# Patient Record
Sex: Female | Born: 2000 | Race: Black or African American | Hispanic: No | Marital: Single | Smoking: Never smoker
Health system: Southern US, Community
[De-identification: ages and names within clinical notes are randomized; demographics above are authoritative.]

## PROBLEM LIST (undated history)

## (undated) DIAGNOSIS — IMO0002 Reserved for concepts with insufficient information to code with codable children: Secondary | ICD-10-CM

## (undated) DIAGNOSIS — D649 Anemia, unspecified: Secondary | ICD-10-CM

## (undated) DIAGNOSIS — A749 Chlamydial infection, unspecified: Secondary | ICD-10-CM

## (undated) DIAGNOSIS — Z5189 Encounter for other specified aftercare: Secondary | ICD-10-CM

## (undated) DIAGNOSIS — F32A Depression, unspecified: Secondary | ICD-10-CM

## (undated) DIAGNOSIS — D689 Coagulation defect, unspecified: Secondary | ICD-10-CM

## (undated) DIAGNOSIS — K529 Noninfective gastroenteritis and colitis, unspecified: Secondary | ICD-10-CM

## (undated) DIAGNOSIS — F419 Anxiety disorder, unspecified: Secondary | ICD-10-CM

## (undated) HISTORY — DX: Coagulation defect, unspecified: D68.9

## (undated) HISTORY — PX: FRACTURE SURGERY: SHX138

## (undated) HISTORY — DX: Encounter for other specified aftercare: Z51.89

## (undated) HISTORY — DX: Depression, unspecified: F32.A

## (undated) HISTORY — DX: Reserved for concepts with insufficient information to code with codable children: IMO0002

## (undated) HISTORY — DX: Anxiety disorder, unspecified: F41.9

## (undated) HISTORY — DX: Chlamydial infection, unspecified: A74.9

## (undated) HISTORY — DX: Anemia, unspecified: D64.9

## (undated) HISTORY — PX: FEMUR CLOSED REDUCTION: SHX939

---
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
## 2019-08-05 ENCOUNTER — Encounter: Payer: Self-pay | Admitting: Advanced Practice Midwife

## 2019-08-05 ENCOUNTER — Other Ambulatory Visit: Payer: Self-pay

## 2019-08-05 ENCOUNTER — Ambulatory Visit (INDEPENDENT_AMBULATORY_CARE_PROVIDER_SITE_OTHER): Payer: Medicaid Other | Admitting: Advanced Practice Midwife

## 2019-08-05 VITALS — BP 108/73 | HR 73 | Ht 64.0 in | Wt 125.0 lb

## 2019-08-05 DIAGNOSIS — Z3042 Encounter for surveillance of injectable contraceptive: Secondary | ICD-10-CM | POA: Insufficient documentation

## 2019-08-05 DIAGNOSIS — Z3202 Encounter for pregnancy test, result negative: Secondary | ICD-10-CM | POA: Diagnosis not present

## 2019-08-05 LAB — POCT URINE PREGNANCY: Preg Test, Ur: NEGATIVE

## 2019-08-05 MED ORDER — MEDROXYPROGESTERONE ACETATE 150 MG/ML IM SUSP
150.0000 mg | Freq: Once | INTRAMUSCULAR | Status: AC
  Administered 2019-08-05: 150 mg via INTRAMUSCULAR

## 2019-08-05 MED ORDER — MEDROXYPROGESTERONE ACETATE 150 MG/ML IM SUSP: 150.0000 mg | INTRAMUSCULAR | 2 refills | Status: DC

## 2019-08-05 NOTE — Progress Notes (Signed)
[  Has been on depo for 5 years and has missed last injection due 8/3 and would like to start back  Last unprotected sex was 8/10  Last depo was may 11th

## 2019-08-05 NOTE — Progress Notes (Signed)
    CONTRACEPTION CARE ENCOUNTER NOTE  History:     Mackenzie Rios is a 18 y.o. G0P0000 female here to restart her Depo provera.  Current complaints: None.   Denies abnormal vaginal bleeding, discharge, pelvic pain, problems with intercourse or other gynecologic concerns.    Gynecologic History No LMP recorded. Patient has had an injection. Contraception: Depo-Provera injections Last Pap: N/A age 62.   Obstetric History OB History  Gravida Para Term Preterm AB Living  0 0 0 0 0 0  SAB TAB Ectopic Multiple Live Births  0 0 0 0 0    History reviewed. No pertinent past medical history.  Past Surgical History:  Procedure Laterality Date  . FEMUR CLOSED REDUCTION      Current Outpatient Medications on File Prior to Visit  Medication Sig Dispense Refill  . cephALEXin (KEFLEX) 500 MG capsule Take 1 capsule (500 mg total) by mouth 4 (four) times daily. (Patient not taking: Reported on 08/05/2019) 28 capsule 0   No current facility-administered medications on file prior to visit.     No Known Allergies  Social History:  reports that she has never smoked. She has never used smokeless tobacco. She reports current drug use. Drug: Marijuana. She reports that she does not drink alcohol.  History reviewed. No pertinent family history.  The following portions of the patient's history were reviewed and updated as appropriate: allergies, current medications, past family history, past medical history, past social history, past surgical history and problem list.  Review of Systems Pertinent items noted in HPI and remainder of comprehensive ROS otherwise negative.  Physical Exam:  BP 108/73   Pulse 73   Ht 5\' 4"  (1.626 m)   Wt 125 lb (56.7 kg)   BMI 21.46 kg/m  CONSTITUTIONAL: Well-developed, well-nourished female in no acute distress.  HENT:  Normocephalic, atraumatic, External right and left ear normal. Oropharynx is clear and moist EYES: Conjunctivae and EOM are normal. Pupils are  equal, round, and reactive to light. No scleral icterus.  NECK: Normal range of motion, supple, no masses.  Normal thyroid.  SKIN: Skin is warm and dry. No rash noted. Not diaphoretic. No erythema. No pallor. MUSCULOSKELETAL: Normal range of motion. No tenderness.  No cyanosis, clubbing, or edema.  2+ distal pulses. NEUROLOGIC: Alert and oriented to person, place, and time. Normal reflexes, muscle tone coordination. No cranial nerve deficit noted. PSYCHIATRIC: Normal mood and affect. Normal behavior. Normal judgment and thought content. CARDIOVASCULAR: Normal heart rate noted, regular rhythm RESPIRATORY: Effort normal, no problems with respiration noted.    Assessment and Plan:    1. Encounter for Depo-Provera contraception  - POCT urine pregnancy= negative - Treat as new start Depo given interval since previous injection - Counseling about other methods declined by patient  Routine preventative health maintenance measures emphasized. Please refer to After Visit Summary for other counseling recommendations.     Mallie Snooks, MSN, CNM Certified Nurse Midwife, [facility] for [employer], [place] [facility] 08/05/19 12:33 PM

## 2019-08-05 NOTE — Patient Instructions (Signed)
Medroxyprogesterone injection [Contraceptive] What is this medicine? MEDROXYPROGESTERONE (me DROX ee proe JES te rone) contraceptive injections prevent pregnancy. They provide effective birth control for 3 months. Depo-subQ Provera 104 is also used for treating pain related to endometriosis. This medicine may be used for other purposes; ask your health care provider or pharmacist if you have questions. COMMON BRAND NAME(S): Depo-Provera, Depo-subQ Provera 104 What should I tell my health care provider before I take this medicine? They need to know if you have any of these conditions:  frequently drink alcohol  asthma  blood vessel disease or a history of a blood clot in the lungs or legs  bone disease such as osteoporosis  breast cancer  diabetes  eating disorder (anorexia nervosa or bulimia)  high blood pressure  HIV infection or AIDS  kidney disease  liver disease  mental depression  migraine  seizures (convulsions)  stroke  tobacco smoker  vaginal bleeding  an unusual or allergic reaction to medroxyprogesterone, other hormones, medicines, foods, dyes, or preservatives  pregnant or trying to get pregnant  breast-feeding How should I use this medicine? Depo-Provera Contraceptive injection is given into a muscle. Depo-subQ Provera 104 injection is given under the skin. These injections are given by a health care professional. You must not be pregnant before getting an injection. The injection is usually given during the first 5 days after the start of a menstrual period or 6 weeks after delivery of a baby. Talk to your pediatrician regarding the use of this medicine in children. Special care may be needed. These injections have been used in female children who have started having menstrual periods. Overdosage: If you think you have taken too much of this medicine contact a poison control center or emergency room at once. NOTE: This medicine is only for you. Do not  share this medicine with others. What if I miss a dose? Try not to miss a dose. You must get an injection once every 3 months to maintain birth control. If you cannot keep an appointment, call and reschedule it. If you wait longer than 13 weeks between Depo-Provera contraceptive injections or longer than 14 weeks between Depo-subQ Provera 104 injections, you could get pregnant. Use another method for birth control if you miss your appointment. You may also need a pregnancy test before receiving another injection. What may interact with this medicine? Do not take this medicine with any of the following medications:  bosentan This medicine may also interact with the following medications:  aminoglutethimide  antibiotics or medicines for infections, especially rifampin, rifabutin, rifapentine, and griseofulvin  aprepitant  barbiturate medicines such as phenobarbital or primidone  bexarotene  carbamazepine  medicines for seizures like ethotoin, felbamate, oxcarbazepine, phenytoin, topiramate  modafinil  St. John's wort This list may not describe all possible interactions. Give your health care provider a list of all the medicines, herbs, non-prescription drugs, or dietary supplements you use. Also tell them if you smoke, drink alcohol, or use illegal drugs. Some items may interact with your medicine. What should I watch for while using this medicine? This drug does not protect you against HIV infection (AIDS) or other sexually transmitted diseases. Use of this product may cause you to lose calcium from your bones. Loss of calcium may cause weak bones (osteoporosis). Only use this product for more than 2 years if other forms of birth control are not right for you. The longer you use this product for birth control the more likely you will be at risk   for weak bones. Ask your health care professional how you can keep strong bones. You may have a change in bleeding pattern or irregular periods.  Many females stop having periods while taking this drug. If you have received your injections on time, your chance of being pregnant is very low. If you think you may be pregnant, see your health care professional as soon as possible. Tell your health care professional if you want to get pregnant within the next year. The effect of this medicine may last a long time after you get your last injection. What side effects may I notice from receiving this medicine? Side effects that you should report to your doctor or health care professional as soon as possible:  allergic reactions like skin rash, itching or hives, swelling of the face, lips, or tongue  breast tenderness or discharge  breathing problems  changes in vision  depression  feeling faint or lightheaded, falls  fever  pain in the abdomen, chest, groin, or leg  problems with balance, talking, walking  unusually weak or tired  yellowing of the eyes or skin Side effects that usually do not require medical attention (report to your doctor or health care professional if they continue or are bothersome):  acne  fluid retention and swelling  headache  irregular periods, spotting, or absent periods  temporary pain, itching, or skin reaction at site where injected  weight gain This list may not describe all possible side effects. Call your doctor for medical advice about side effects. You may report side effects to FDA at 1-800-FDA-1088. Where should I keep my medicine? This does not apply. The injection will be given to you by a health care professional. NOTE: This sheet is a summary. It may not cover all possible information. If you have questions about this medicine, talk to your doctor, pharmacist, or health care provider.  2020 Elsevier/Gold Standard (2008-12-10 18:37:56)   Intrauterine Device Insertion An intrauterine device (IUD) is a medical device that gets inserted into the uterus to prevent pregnancy. It is a  small, T-shaped device that has one or two nylon strings hanging down from it. The strings hang out of the lower part of the uterus (cervix) to allow for future IUD removal. There are two types of IUDs available:  Copper IUD. This type of IUD has copper wire wrapped around it. Copper makes the uterus and fallopian tubes produce a fluid that kills sperm. A copper IUD may last up to 10 years.  Hormone IUD. This type of IUD is made of plastic and contains the hormone progestin (synthetic progesterone). The hormone thickens mucus in the cervix and prevents sperm from entering the uterus. It also thins the uterine lining to prevent implantation of a fertilized egg. The hormone can weaken or kill the sperm that get into the uterus. A hormone IUD may last 3-5 years. Tell a health care provider about:  Any allergies you have.  All medicines you are taking, including vitamins, herbs, eye drops, creams, and over-the-counter medicines.  Any problems you or family members have had with anesthetic medicines.  Any blood disorders you have.  Any surgeries you have had.  Any medical conditions you have, including any STIs (sexually transmitted infections) you may have.  Whether you are pregnant or may be pregnant. What are the risks? Generally, this is a safe procedure. However, problems may occur, including:  Infection.  Bleeding.  Allergic reactions to medicines.  Accidental puncture (perforation) of the uterus, or damage to  other structures or organs.  Accidental placement of the IUD either in the muscle layer of the uterus (myometrium) or outside the uterus.  The IUD falling out of the uterus (expulsion). This is more common among women who have recently had a child.  Pregnancy that happens in the fallopian tube (ectopic pregnancy).  Infection of the uterus and fallopian tubes (pelvic inflammatory disease). What happens before the procedure?  Schedule the IUD insertion for when you will  have your menstrual period or right after, to make sure you are not pregnant. Placement of the IUD is better tolerated shortly after a menstrual cycle.  Follow instructions from your health care provider about eating or drinking restrictions.  Ask your health care provider about changing or stopping your regular medicines. This is especially important if you are taking diabetes medicines or blood thinners.  You may get a pain reliever to take before the procedure.  You may have tests for: ? Pregnancy. A pregnancy test involves having a urine sample taken. ? STIs. Placing an IUD in someone who has an STI can make the infection worse. ? Cervical cancer. You may have a Pap test to check for this type of cancer. This means collecting cells from your cervix to be examined under a microscope.  You may have a physical exam to determine the size and position of your uterus. The procedure may vary among health care providers and hospitals. What happens during the procedure?  A tool (speculum) will be placed in your vagina and widened so that your health care provider can see your cervix.  Medicine may be applied to your cervix to help lower your risk of infection (antiseptic medicine).  You may be given an anesthetic medicine to numb each side of your cervix (intracervical block or paracervical block). This medicine is usually given by an injection into the cervix.  A tool (uterine sound) will be inserted into your uterus to determine the length of your uterus and the direction that your uterus may be tilted.  A slim instrument or tube (IUD inserter) that holds the IUD will be inserted into your vagina, through your cervical canal, and into your uterus.  The IUD will be placed in the uterus, and the IUD inserter will be removed.  The strings that are attached to the IUD will be trimmed so that they lie just below the cervix. The procedure may vary among health care providers and hospitals. What  happens after the procedure?  You may have bleeding after the procedure. This is normal. It varies from light bleeding (spotting) for a few days to menstrual-like bleeding.  You may have cramping and pain.  You may feel dizzy or light-headed.  You may have lower back pain. Summary  An intrauterine device (IUD) is a small, T-shaped device that has one or two nylon strings hanging down from it.  Two types of IUDs are available. You may have a copper IUD or a hormone IUD.  Schedule the IUD insertion for when you will have your menstrual period or right after, to make sure you are not pregnant. Placement of the IUD is better tolerated shortly after a menstrual cycle.  You may have bleeding after the procedure. This is normal. It varies from light spotting for a few days to menstrual-like bleeding. This information is not intended to replace advice given to you by your health care provider. Make sure you discuss any questions you have with your health care provider. Document Released: 07/18/2011 Document  Revised: 11/01/2017 Document Reviewed: 10/10/2016 Elsevier Patient Education  [place].

## 2019-10-21 ENCOUNTER — Ambulatory Visit: Payer: Medicaid Other

## 2019-10-22 ENCOUNTER — Ambulatory Visit (INDEPENDENT_AMBULATORY_CARE_PROVIDER_SITE_OTHER): Payer: Medicaid Other | Admitting: *Deleted

## 2019-10-22 ENCOUNTER — Other Ambulatory Visit: Payer: Self-pay

## 2019-10-22 VITALS — BP 106/70 | HR 92

## 2019-10-22 DIAGNOSIS — Z3042 Encounter for surveillance of injectable contraceptive: Secondary | ICD-10-CM | POA: Diagnosis not present

## 2019-10-22 MED ORDER — MEDROXYPROGESTERONE ACETATE 150 MG/ML IM SUSP
150.0000 mg | Freq: Once | INTRAMUSCULAR | Status: AC
  Administered 2019-10-22: 150 mg via INTRAMUSCULAR

## 2019-10-22 NOTE — Progress Notes (Signed)
Date last pap: NA  Last Depo-Provera:08/05/2019. Side Effects if any: NA. Serum HCG indicated? NA. Depo-Provera 150 mg IM given by: Crosby Oyster, RN. Next appointment due Feb 4-Feb 18th 2021.

## 2019-10-27 NOTE — Progress Notes (Signed)
Patient seen and assessed by nursing staff during this encounter. I have reviewed the chart and agree with the documentation and plan.  Zackory Pudlo, MD 10/27/2019 12:19 AM    

## 2019-12-03 ENCOUNTER — Encounter (HOSPITAL_COMMUNITY): Payer: Self-pay | Admitting: *Deleted

## 2019-12-03 ENCOUNTER — Emergency Department (HOSPITAL_COMMUNITY)
Admission: EM | Admit: 2019-12-03 | Discharge: 2019-12-03 | Disposition: A | Discharge status: Home / Self Care | Payer: BC Managed Care – PPO | Attending: Emergency Medicine | Admitting: Emergency Medicine

## 2019-12-03 ENCOUNTER — Other Ambulatory Visit: Payer: Self-pay

## 2019-12-03 DIAGNOSIS — Z79899 Other long term (current) drug therapy: Secondary | ICD-10-CM | POA: Insufficient documentation

## 2019-12-03 DIAGNOSIS — S41012A Laceration without foreign body of left shoulder, initial encounter: Secondary | ICD-10-CM | POA: Diagnosis not present

## 2019-12-03 DIAGNOSIS — Y999 Unspecified external cause status: Secondary | ICD-10-CM | POA: Insufficient documentation

## 2019-12-03 DIAGNOSIS — S41052A Open bite of left shoulder, initial encounter: Secondary | ICD-10-CM

## 2019-12-03 DIAGNOSIS — Y929 Unspecified place or not applicable: Secondary | ICD-10-CM | POA: Diagnosis not present

## 2019-12-03 DIAGNOSIS — Y939 Activity, unspecified: Secondary | ICD-10-CM | POA: Diagnosis not present

## 2019-12-03 DIAGNOSIS — W540XXA Bitten by dog, initial encounter: Secondary | ICD-10-CM | POA: Diagnosis not present

## 2019-12-03 DIAGNOSIS — S4992XA Unspecified injury of left shoulder and upper arm, initial encounter: Secondary | ICD-10-CM | POA: Diagnosis present

## 2019-12-03 MED ORDER — AMOXICILLIN-POT CLAVULANATE 875-125 MG PO TABS: 1.0000 | ORAL_TABLET | Freq: Two times a day (BID) | ORAL | 0 refills | Status: AC

## 2019-12-03 NOTE — ED Provider Notes (Signed)
[facility] Provider Note   CSN: [id number] Arrival date & time: 12/03/19  0032     History Chief Complaint  Patient presents with  . Animal Bite    Mackenzie Rios is a 18 y.o. female.  Pt & friend were play fighting, pt's dog became upset & bit pt's shoulder.  Has 2 bite wounds to anterior shoulder, 1 wound to posterior shoulder.    Animal Bite Contact animal:  Dog Location:  Shoulder/arm Shoulder/arm injury location:  L shoulder Pain details:    Quality:  Aching   Severity:  Mild Incident location:  Home Animal's rabies vaccination status:  Up to date Animal in possession: yes   Tetanus status:  Up to date Relieved by:  None tried Associated symptoms: no fever, no numbness and no swelling        History reviewed. No pertinent past medical history.  Patient Active Problem List   Diagnosis Date Noted  . Depo-Provera contraceptive status 08/05/2019    Past Surgical History:  Procedure Laterality Date  . FEMUR CLOSED REDUCTION       OB History    Gravida  0   Para  0   Term  0   Preterm  0   AB  0   Living  0     SAB  0   TAB  0   Ectopic  0   Multiple  0   Live Births  0           No family history on file.  Social History   Tobacco Use  . Smoking status: Never Smoker  . Smokeless tobacco: Never Used  Substance Use Topics  . Alcohol use: No  . Drug use: Yes    Types: Marijuana    Home Medications Prior to Admission medications   Medication Sig Start Date End Date Taking? Authorizing Provider  amoxicillin-clavulanate (AUGMENTIN) 875-125 MG tablet Take 1 tablet by mouth every 12 (twelve) hours for 3 days. 12/03/19 12/06/19  Viviano Simasobinson, Luria Rosario, NP  cephALEXin (KEFLEX) 500 MG capsule Take 1 capsule (500 mg total) by mouth 4 (four) times daily. Patient not taking: Reported on 08/05/2019 02/29/16   Mady GemmaWestfall, Elizabeth C, PA-C  medroxyPROGESTERone (DEPO-PROVERA) 150 MG/ML injection Inject 1 mL (150  mg total) into the muscle every 3 (three) months. 08/05/19   Calvert CantorWeinhold, Samantha C, CNM    Allergies    Patient has no known allergies.  Review of Systems   Review of Systems  Constitutional: Negative for fever.  Neurological: Negative for numbness.  All other systems reviewed and are negative.   Physical Exam Updated Vital Signs BP 116/78 (BP Location: Right Arm)   Pulse 92   Temp 99.5 F (37.5 C) (Oral)   Resp 16   Ht 5\' 4"  (1.626 m)   Wt 61.2 kg   SpO2 98%   BMI 23.17 kg/m   Physical Exam Vitals and nursing note reviewed.  Constitutional:      General: She is not in acute distress.    Appearance: Normal appearance.  HENT:     Head: Normocephalic and atraumatic.     Nose: Nose normal.     Mouth/Throat:     Mouth: Mucous membranes are moist.     Pharynx: Oropharynx is clear.  Eyes:     Extraocular Movements: Extraocular movements intact.     Conjunctiva/sclera: Conjunctivae normal.  Cardiovascular:     Rate and Rhythm: Normal rate.     Pulses:  Normal pulses.  Pulmonary:     Effort: Pulmonary effort is normal.  Musculoskeletal:        General: Normal range of motion.     Cervical back: Normal range of motion.  Skin:    General: Skin is warm and dry.     Capillary Refill: Capillary refill takes less than 2 seconds.     Comments: Anterior L shoulder w/ 2 puncture wounds- 1 cm & 1/2 cm long, both ~5 mm depth. Posterior L should w/ 2 cm long, 5 mm deep laceration.  No muscle involvement.   Neurological:     General: No focal deficit present.     Mental Status: She is alert.     Coordination: Coordination normal.     ED Results / Procedures / Treatments   Labs (all labs ordered are listed, but only abnormal results are displayed) Labs Reviewed - No data to display  EKG None  Radiology No results found.  Procedures .Marland KitchenLaceration Repair  Date/Time: 12/03/2019 2:03 AM Performed by: Viviano Simas, NP Authorized by: Viviano Simas, NP   Consent:     Consent obtained:  Verbal   Consent given by:  Patient   Risks discussed:  Infection and poor wound healing Anesthesia (see MAR for exact dosages):    Anesthesia method:  None Laceration details:    Location:  Shoulder/arm   Shoulder/arm location:  L shoulder   Length (cm):  2   Depth (mm):  5 Repair type:    Repair type:  Simple Exploration:    Wound exploration: wound explored through full range of motion and entire depth of wound probed and visualized     Wound extent: no muscle damage noted   Treatment:    Area cleansed with:  Shur-Clens   Amount of cleaning:  Extensive   Irrigation solution:  Sterile saline   Irrigation volume:  120   Irrigation method:  Syringe Skin repair:    Repair method:  Tissue adhesive Approximation:    Approximation:  Loose Post-procedure details:    Dressing:  Open (no dressing)   Patient tolerance of procedure:  Tolerated well, no immediate complications Comments:     Posterior shoulder .Marland KitchenLaceration Repair  Date/Time: 12/03/2019 2:04 AM Performed by: Viviano Simas, NP Authorized by: Viviano Simas, NP   Consent:    Consent obtained:  Verbal   Consent given by:  Patient   Risks discussed:  Infection and poor wound healing Anesthesia (see MAR for exact dosages):    Anesthesia method:  None Laceration details:    Location:  Shoulder/arm   Shoulder/arm location:  L shoulder   Length (cm):  1   Depth (mm):  5 Repair type:    Repair type:  Simple Exploration:    Wound exploration: wound explored through full range of motion and entire depth of wound probed and visualized     Wound extent: no muscle damage noted   Treatment:    Area cleansed with:  Shur-Clens   Amount of cleaning:  Extensive   Irrigation solution:  Sterile saline   Irrigation volume:  60   Irrigation method:  Syringe Skin repair:    Repair method:  Tissue adhesive Approximation:    Approximation:  Loose Post-procedure details:    Dressing:  Open (no  dressing)   Patient tolerance of procedure:  Tolerated well, no immediate complications Comments:     Anterior shoulder   (including critical care time)  Medications Ordered in ED Medications - No data to display  ED Course  I have reviewed the triage vital signs and the nursing notes.  Pertinent labs & imaging results that were available during my care of the patient were reviewed by me and considered in my medical decision making (see chart for details).    MDM Rules/Calculators/A&P                      6 yof w/ dog bite to L shoulder.  Pt & dog both w/ vaccines current. Full ROM of L shoulder. Tolerated wound closure w/ steri strips well.  Augmentin rx for infection prophylaxis.  Well appearing otherwise.  Discussed supportive care as well need for f/u w/ PCP in 1-2 days.  Also discussed sx that warrant sooner re-eval in ED. Patient / Family / Caregiver informed of clinical course, understand medical decision-making process, and agree with plan.  Final Clinical Impression(s) / ED Diagnoses Final diagnoses:  Open wound of left shoulder due to dog bite    Rx / DC Orders ED Discharge Orders         Ordered    amoxicillin-clavulanate (AUGMENTIN) 875-125 MG tablet  Every 12 hours     12/03/19 0151           Charmayne Sheer, NP 12/03/19 9024    Fatima Blank, MD 12/04/19 [phone]

## 2019-12-03 NOTE — ED Triage Notes (Signed)
The pt is c/o her dog bitting her lt shoulder while she was wrestling with a friend teeth marks on the anterior and posterior  Lt shoulder  Minimal bleeding

## 2020-01-07 ENCOUNTER — Ambulatory Visit: Payer: Medicaid Other

## 2020-01-11 ENCOUNTER — Ambulatory Visit: Payer: Self-pay

## 2020-01-19 ENCOUNTER — Ambulatory Visit (INDEPENDENT_AMBULATORY_CARE_PROVIDER_SITE_OTHER): Payer: Medicaid Other | Admitting: *Deleted

## 2020-01-19 ENCOUNTER — Other Ambulatory Visit: Payer: Self-pay

## 2020-01-19 VITALS — BP 120/74 | HR 77

## 2020-01-19 DIAGNOSIS — Z3042 Encounter for surveillance of injectable contraceptive: Secondary | ICD-10-CM | POA: Diagnosis not present

## 2020-01-19 MED ORDER — MEDROXYPROGESTERONE ACETATE 150 MG/ML IM SUSP
150.0000 mg | Freq: Once | INTRAMUSCULAR | Status: AC
  Administered 2020-01-19: 17:00:00 150 mg via INTRAMUSCULAR

## 2020-01-19 NOTE — Progress Notes (Signed)
Date last pap: NA. Last Depo-Provera: 10/22/2019. Side Effects if any: none. Serum HCG indicated? NA. Depo-Provera 150 mg IM given by: Scheryl Marten, RN. Next appointment due May 4-May18.

## 2020-01-20 NOTE — Progress Notes (Signed)
Patient seen and assessed by nursing staff during this encounter. I have reviewed the chart and agree with the documentation and plan.  Dalisa Forrer, MD 01/20/2020 1:06 PM    

## 2020-02-29 ENCOUNTER — Ambulatory Visit: Payer: Medicaid Other

## 2020-04-05 ENCOUNTER — Ambulatory Visit: Payer: Medicaid Other

## 2020-04-18 ENCOUNTER — Ambulatory Visit (INDEPENDENT_AMBULATORY_CARE_PROVIDER_SITE_OTHER): Payer: Self-pay

## 2020-04-18 ENCOUNTER — Other Ambulatory Visit: Payer: Self-pay

## 2020-04-18 VITALS — BP 125/78 | HR 82 | Wt 122.8 lb

## 2020-04-18 DIAGNOSIS — Z3042 Encounter for surveillance of injectable contraceptive: Secondary | ICD-10-CM

## 2020-04-18 MED ORDER — MEDROXYPROGESTERONE ACETATE 150 MG/ML IM SUSP
150.0000 mg | Freq: Once | INTRAMUSCULAR | Status: AC
  Administered 2020-04-18: 150 mg via INTRAMUSCULAR

## 2020-04-18 NOTE — Progress Notes (Addendum)
Mattie Marlin here for Depo-Provera  Injection. Depo-Provera picked up from pharmacy by patient. Injection administered without complication. Patient will return in 3 months for next injection. Explained to pt our office will supply Depo-Provera, pt will not need to continue supplying medication.  Marjo Bicker, RN 04/18/2020  10:24 AM      Patient was assessed and managed by nursing staff during this encounter. I have reviewed the chart and agree with the documentation and plan. I have also made any necessary editorial changes.  Marylen Ponto, NP 04/18/2020 11:02 AM

## 2020-07-04 ENCOUNTER — Ambulatory Visit: Payer: Medicaid Other

## 2020-07-13 ENCOUNTER — Ambulatory Visit: Payer: Medicaid Other

## 2020-09-07 ENCOUNTER — Encounter: Payer: Self-pay | Admitting: Student

## 2020-09-07 ENCOUNTER — Other Ambulatory Visit: Payer: Self-pay

## 2020-09-07 ENCOUNTER — Ambulatory Visit (INDEPENDENT_AMBULATORY_CARE_PROVIDER_SITE_OTHER): Payer: BC Managed Care – PPO | Admitting: Student

## 2020-09-07 ENCOUNTER — Other Ambulatory Visit (HOSPITAL_COMMUNITY)
Admission: RE | Admit: 2020-09-07 | Discharge: 2020-09-07 | Disposition: A | Discharge status: Home / Self Care | Payer: Medicaid Other | Source: Ambulatory Visit | Attending: Student | Admitting: Student

## 2020-09-07 VITALS — BP 113/75 | HR 87 | Ht 64.0 in | Wt 125.8 lb

## 2020-09-07 DIAGNOSIS — Z3042 Encounter for surveillance of injectable contraceptive: Secondary | ICD-10-CM

## 2020-09-07 DIAGNOSIS — Z01419 Encounter for gynecological examination (general) (routine) without abnormal findings: Secondary | ICD-10-CM | POA: Diagnosis present

## 2020-09-07 MED ORDER — MEDROXYPROGESTERONE ACETATE 150 MG/ML IM SUSP
150.0000 mg | Freq: Once | INTRAMUSCULAR | Status: AC
  Administered 2020-09-07: 150 mg via INTRAMUSCULAR

## 2020-09-07 MED ORDER — MEDROXYPROGESTERONE ACETATE 150 MG/ML IM SUSP: 150.0000 mg | INTRAMUSCULAR | 3 refills | Status: DC

## 2020-09-07 NOTE — Progress Notes (Signed)
  History:  [name] is a 19 y.o. G0P0000 who presents to clinic today for annual and restart Depo. Patient has been on Depo for 7 years, and missed her August dose. She would like to restart today. She does not want any STI testing. [facility] but does not remember the results.    The following portions of the patient's history were reviewed and updated as appropriate: allergies, current medications, family history, past medical history, social history, past surgical history and problem list.  Review of Systems:  Review of Systems  Constitutional: Negative.   HENT: Negative.   Eyes: Negative.   Respiratory: Negative.   Cardiovascular: Negative.   Genitourinary: Negative.   Musculoskeletal: Negative.   Skin: Negative.   Neurological: Negative.   Psychiatric/Behavioral: Negative.       Objective:  Physical Exam BP 113/75   Pulse 87   Ht 5\' 4"  (1.626 m)   Wt 125 lb 12.8 oz (57.1 kg)   LMP 07/13/2020 (Exact Date)   BMI 21.59 kg/m  Physical Exam Constitutional:      Appearance: Normal appearance.  HENT:     Head: Normocephalic.  Cardiovascular:     Rate and Rhythm: Normal rate.  Pulmonary:     Effort: Pulmonary effort is normal.  Abdominal:     General: Abdomen is flat.  Genitourinary:    Comments: NEFG; trace dark red blood in the vagina, no CMT, suprapubic or adnexal tenderness.  Musculoskeletal:        General: Normal range of motion.     Cervical back: Normal range of motion.  Skin:    General: Skin is warm.  Neurological:     Mental Status: She is alert.    Breast exam benign, no lumps, masses. Explained how to do monthly breast exam.      Labs and Imaging No results found for this or any previous visit (from the past 24 hour(s)).  No results found.   Assessment & Plan:   1. Well woman exam   2. Depo-Provera contraceptive status   -Pap today, declines STI testing.  -Healthy well woman exam  today  09/12/2020, CNM 09/07/2020 3:53 PM

## 2020-09-07 NOTE — Addendum Note (Signed)
Addended by: Leola Brazil on: 09/07/2020 04:17 PM   Modules accepted: Orders

## 2020-09-09 LAB — CYTOLOGY - PAP: Diagnosis: NEGATIVE

## 2020-10-18 ENCOUNTER — Ambulatory Visit: Payer: Medicaid Other

## 2020-11-18 ENCOUNTER — Emergency Department (HOSPITAL_BASED_OUTPATIENT_CLINIC_OR_DEPARTMENT_OTHER)
Admission: EM | Admit: 2020-11-18 | Discharge: 2020-11-18 | Disposition: A | Discharge status: Home / Self Care | Payer: Self-pay | Attending: Emergency Medicine | Admitting: Emergency Medicine

## 2020-11-18 ENCOUNTER — Emergency Department (HOSPITAL_BASED_OUTPATIENT_CLINIC_OR_DEPARTMENT_OTHER): Payer: Self-pay

## 2020-11-18 ENCOUNTER — Other Ambulatory Visit: Payer: Self-pay

## 2020-11-18 ENCOUNTER — Encounter (HOSPITAL_BASED_OUTPATIENT_CLINIC_OR_DEPARTMENT_OTHER): Payer: Self-pay

## 2020-11-18 ENCOUNTER — Other Ambulatory Visit: Payer: BC Managed Care – PPO

## 2020-11-18 DIAGNOSIS — K529 Noninfective gastroenteritis and colitis, unspecified: Secondary | ICD-10-CM | POA: Insufficient documentation

## 2020-11-18 LAB — CBC
HCT: 37.5 % (ref 36.0–46.0)
Hemoglobin: 12.4 g/dL (ref 12.0–15.0)
MCH: 27.7 pg (ref 26.0–34.0)
MCHC: 33.1 g/dL (ref 30.0–36.0)
MCV: 83.7 fL (ref 80.0–100.0)
Platelets: 269 10*3/uL (ref 150–400)
RBC: 4.48 MIL/uL (ref 3.87–5.11)
RDW: 12.5 % (ref 11.5–15.5)
WBC: 10.2 10*3/uL (ref 4.0–10.5)
nRBC: 0 % (ref 0.0–0.2)

## 2020-11-18 LAB — URINALYSIS, ROUTINE W REFLEX MICROSCOPIC
Bilirubin Urine: NEGATIVE
Glucose, UA: NEGATIVE mg/dL
Ketones, ur: 15 mg/dL — AB
Nitrite: NEGATIVE
Protein, ur: NEGATIVE mg/dL
Specific Gravity, Urine: >1.03 — ABNORMAL HIGH (ref 1.005–1.030)
pH: 6 (ref 5.0–8.0)

## 2020-11-18 LAB — COMPREHENSIVE METABOLIC PANEL
ALT: 10 U/L (ref 0–44)
AST: 13 U/L — ABNORMAL LOW (ref 15–41)
Albumin: 4 g/dL (ref 3.5–5.0)
Alkaline Phosphatase: 64 U/L (ref 38–126)
Anion gap: 12 (ref 5–15)
BUN: 8 mg/dL (ref 6–20)
CO2: 21 mmol/L — ABNORMAL LOW (ref 22–32)
Calcium: 9.2 mg/dL (ref 8.9–10.3)
Chloride: 102 mmol/L (ref 98–111)
Creatinine, Ser: 0.79 mg/dL (ref 0.44–1.00)
GFR, Estimated: >60 mL/min (ref >60)
Glucose, Bld: 100 mg/dL — ABNORMAL HIGH (ref 70–99)
Potassium: 3.5 mmol/L (ref 3.5–5.1)
Sodium: 135 mmol/L (ref 135–145)
Total Bilirubin: 0.4 mg/dL (ref 0.3–1.2)
Total Protein: 8.1 g/dL (ref 6.5–8.1)

## 2020-11-18 LAB — URINALYSIS, MICROSCOPIC (REFLEX): WBC, UA: >50 WBC/hpf (ref 0–5)

## 2020-11-18 LAB — PREGNANCY, URINE: Preg Test, Ur: NEGATIVE

## 2020-11-18 LAB — LIPASE, BLOOD: Lipase: 23 U/L (ref 11–51)

## 2020-11-18 MED ORDER — METHYLPREDNISOLONE SODIUM SUCC 125 MG IJ SOLR
125.0000 mg | Freq: Once | INTRAMUSCULAR | Status: AC
  Administered 2020-11-18: 125 mg via INTRAVENOUS
  Filled 2020-11-18: qty 2

## 2020-11-18 MED ORDER — METRONIDAZOLE 500 MG PO TABS
500.0000 mg | ORAL_TABLET | Freq: Once | ORAL | Status: AC
  Administered 2020-11-18: 500 mg via ORAL
  Filled 2020-11-18: qty 1

## 2020-11-18 MED ORDER — SODIUM CHLORIDE 0.9 % IV SOLN
250.0000 mg | Freq: Once | INTRAVENOUS | Status: DC
  Filled 2020-11-18: qty 2

## 2020-11-18 MED ORDER — PREDNISONE 10 MG PO TABS: 60.0000 mg | ORAL_TABLET | Freq: Every day | ORAL | 0 refills | Status: AC

## 2020-11-18 MED ORDER — IOHEXOL 300 MG/ML  SOLN
100.0000 mL | Freq: Once | INTRAMUSCULAR | Status: AC | PRN
  Administered 2020-11-18: 100 mL via INTRAVENOUS

## 2020-11-18 MED ORDER — METRONIDAZOLE 500 MG PO TABS: 500.0000 mg | ORAL_TABLET | Freq: Three times a day (TID) | ORAL | 0 refills | Status: AC

## 2020-11-18 MED ORDER — CIPROFLOXACIN HCL 500 MG PO TABS: 500.0000 mg | ORAL_TABLET | Freq: Two times a day (BID) | ORAL | 0 refills | Status: AC

## 2020-11-18 MED ORDER — CIPROFLOXACIN HCL 500 MG PO TABS
500.0000 mg | ORAL_TABLET | Freq: Once | ORAL | Status: AC
  Administered 2020-11-18: 500 mg via ORAL
  Filled 2020-11-18: qty 1

## 2020-11-18 NOTE — ED Provider Notes (Signed)
[facility] EMERGENCY DEPARTMENT Provider Note   CSN: [id number] Arrival date & time: 11/18/20  1607     History Chief Complaint  Patient presents with   Abdominal Pain   Constipation    Mackenzie Rios is a 18 y.o. female presenting to the ED with constipation.  Patient reports 1 month ago she began having 1-2 weeks of diarrhea.  Subsequently she's developed constipation.  She has been taking laxatives but is only able to have small liquid bowel movements, no regular stools.  She denies rectal pain with defecation.  She reports diminished appetite but no significant nausea or vomiting.  She denies abdominal pain aside from cramping pains when attempting to have BM.  She denies dysuria or hematuria.  Denies fevers or chills.  Denies hx of constipation, new drug use, or family hx of IBD.  Denies hx of abd surgeries.  HPI     History reviewed. No pertinent past medical history.  Patient Active Problem List   Diagnosis Date Noted   Depo-Provera contraceptive status 08/05/2019    Past Surgical History:  Procedure Laterality Date   FEMUR CLOSED REDUCTION       OB History    Gravida  0   Para  0   Term  0   Preterm  0   AB  0   Living  0     SAB  0   IAB  0   Ectopic  0   Multiple  0   Live Births  0           History reviewed. No pertinent family history.  Social History   Tobacco Use   Smoking status: Never Smoker   Smokeless tobacco: Never Used  Vaping Use   Vaping Use: Never used  Substance Use Topics   Alcohol use: Yes    Comment: occassional   Drug use: Yes    Types: Marijuana    Home Medications Prior to Admission medications   Medication Sig Start Date End Date Taking? Authorizing Provider  cephALEXin (KEFLEX) 500 MG capsule Take 1 capsule (500 mg total) by mouth 4 (four) times daily. Patient not taking: Reported on 08/05/2019 02/29/16   Mady Gemma, PA-C  ciprofloxacin (CIPRO) 500 MG tablet Take 1 tablet  (500 mg total) by mouth 2 (two) times daily for 7 days. 11/19/20 11/26/20  Terald Sleeper, MD  medroxyPROGESTERone (DEPO-PROVERA) 150 MG/ML injection Inject 1 mL (150 mg total) into the muscle every 3 (three) months. 08/05/19   Calvert Cantor, CNM  medroxyPROGESTERone (DEPO-PROVERA) 150 MG/ML injection Inject 1 mL (150 mg total) into the muscle every 3 (three) months. 09/07/20   Marylene Land, CNM  metroNIDAZOLE (FLAGYL) 500 MG tablet Take 1 tablet (500 mg total) by mouth 3 (three) times daily for 7 days. 11/19/20 11/26/20  Terald Sleeper, MD  predniSONE (DELTASONE) 10 MG tablet Take 6 tablets (60 mg total) by mouth daily for 5 days. 11/19/20 11/24/20  Terald Sleeper, MD    Allergies    Patient has no known allergies.  Review of Systems   Review of Systems  Constitutional: Negative for chills and fever.  Eyes: Negative for pain and visual disturbance.  Respiratory: Negative for cough and shortness of breath.   Cardiovascular: Negative for chest pain and palpitations.  Gastrointestinal: Positive for abdominal pain and constipation. Negative for blood in stool, nausea and vomiting.  Genitourinary: Negative for dysuria and hematuria.  Musculoskeletal: Negative for arthralgias and myalgias.  Skin: Negative for color change and rash.  Neurological: Negative for syncope, light-headedness and headaches.  Psychiatric/Behavioral: Negative for agitation and confusion.  All other systems reviewed and are negative.   Physical Exam Updated Vital Signs BP 122/78 (BP Location: Right Arm)    Pulse 76    Temp 98.4 F (36.9 C) (Oral)    Resp 16    Ht 5\' 4"  (1.626 m)    Wt 55.3 kg    LMP 11/17/2020    SpO2 99%    BMI 20.94 kg/m   Physical Exam Vitals and nursing note reviewed.  Constitutional:      General: She is not in acute distress.    Appearance: She is well-developed and well-nourished.  HENT:     Head: Normocephalic and atraumatic.  Eyes:     Conjunctiva/sclera:  Conjunctivae normal.  Cardiovascular:     Rate and Rhythm: Normal rate and regular rhythm.     Heart sounds: Normal heart sounds.  Pulmonary:     Effort: Pulmonary effort is normal. No respiratory distress.     Breath sounds: Normal breath sounds.  Abdominal:     Palpations: Abdomen is soft.     Tenderness: There is no abdominal tenderness. There is no guarding or rebound. Negative signs include Murphy's sign and McBurney's sign.  Musculoskeletal:        General: No edema.     Cervical back: Neck supple.  Skin:    General: Skin is warm and dry.  Neurological:     General: No focal deficit present.     Mental Status: She is alert and oriented to person, place, and time.  Psychiatric:        Mood and Affect: Mood and affect and mood normal.        Behavior: Behavior normal.     ED Results / Procedures / Treatments   Labs (all labs ordered are listed, but only abnormal results are displayed) Labs Reviewed  COMPREHENSIVE METABOLIC PANEL - Abnormal; Notable for the following components:      Result Value   CO2 21 (*)    Glucose, Bld 100 (*)    AST 13 (*)    All other components within normal limits  URINALYSIS, ROUTINE W REFLEX MICROSCOPIC - Abnormal; Notable for the following components:   APPearance CLOUDY (*)    Specific Gravity, Urine >1.030 (*)    Hgb urine dipstick MODERATE (*)    Ketones, ur 15 (*)    Leukocytes,Ua SMALL (*)    All other components within normal limits  URINALYSIS, MICROSCOPIC (REFLEX) - Abnormal; Notable for the following components:   Bacteria, UA MANY (*)    All other components within normal limits  LIPASE, BLOOD  CBC  PREGNANCY, URINE    EKG None  Radiology CT ABDOMEN PELVIS W CONTRAST  Result Date: 11/18/2020 CLINICAL DATA:  Diverticulitis suspected Lower abdominal pain. Patient reports blood in stool. EXAM: CT ABDOMEN AND PELVIS WITH CONTRAST TECHNIQUE: Multidetector CT imaging of the abdomen and pelvis was performed using the standard  protocol following bolus administration of intravenous contrast. CONTRAST:  100mL OMNIPAQUE IOHEXOL 300 MG/ML  SOLN COMPARISON:  None. FINDINGS: Lower chest: This lung bases are clear. No focal airspace disease or pleural fluid. Hepatobiliary: Tiny subcentimeter hypodensity in the right lobe of the liver is too small to characterize. Minimal focal fatty infiltration adjacent to the falciform ligament. Gallbladder physiologically distended, no calcified stone. No biliary dilatation. Pancreas: No ductal dilatation or inflammation. Spleen: Normal in size.  Vague subcentimeter hypodensity in the superior spleen is nonspecific. Adrenals/Urinary Tract: No adrenal nodule. No hydronephrosis or perinephric edema. Homogeneous renal enhancement. Urinary bladder is nondistended and not well evaluated. Stomach/Bowel: Bowel evaluation is limited in the absence of enteric contrast and paucity of intra-abdominal fat. There is pan colonic wall thickening and pericolonic edema from the cecum to the rectum consistent with colitis. No obvious colonic diverticula. Limited small bowel assessment without definite small bowel inflammation. The stomach is decompressed. Appendix is tentatively visualized and normal. Vascular/Lymphatic: Normal caliber abdominal aorta. Patent portal vein. The IMV is hypertrophied. Limited assessment for adenopathy. No bulky enlarged lymph nodes. Reproductive: Anteverted uterus. No evidence of adnexal mass. Other: Small volume of free fluid in the pelvis. No free air or evidence of perforation. No abscess. Musculoskeletal: There are no acute or suspicious osseous abnormalities. Intramedullary nail with trans trochanteric screw fixation of left proximal femur is partially included. No sacroiliac joint changes. IMPRESSION: 1. Pancolitis from the cecum to the rectum, may be infectious or inflammatory bowel disease. Distribution would favor ulcerative colitis over Crohn's. Small bowel assessment is limited in the  absence of contrast and paucity of intra-abdominal fat. 2. Small volume of free fluid in the pelvis is likely reactive. Electronically Signed   By: Narda Rutherford M.D.   On: 11/18/2020 20:22    Procedures Procedures (including critical care time)  Medications Ordered in ED Medications  iohexol (OMNIPAQUE) 300 MG/ML solution 100 mL (100 mLs Intravenous Contrast Given 11/18/20 2009)  metroNIDAZOLE (FLAGYL) tablet 500 mg (500 mg Oral Given 11/18/20 2121)  ciprofloxacin (CIPRO) tablet 500 mg (500 mg Oral Given 11/18/20 2121)  methylPREDNISolone sodium succinate (SOLU-MEDROL) 125 mg/2 mL injection 125 mg (125 mg Intravenous Given 11/18/20 2122)    ED Course  I have reviewed the triage vital signs and the nursing notes.  Pertinent labs & imaging results that were available during my care of the patient were reviewed by me and considered in my medical decision making (see chart for details).  This patient complains of constipation x 2 weeks. This involves an extensive number of treatment options, and is a complaint that carries with it a high risk of complications and morbidity.  The differential diagnosis includes IBD vs colitis vs bowel obstruction vs functional constipation vs other  I ordered, reviewed, and interpreted labs, which included WBC 10.2, Hgb 12.4, Preg negative, CMP unremarkable, UA likely contaminated specimen, no overt signs of UTI, lipase 23 I ordered medication Cipro, Flagyl, IV solumedrol for colitis which may be inflammatory or infectious I ordered imaging studies which included CT abd/pelvis with IV contrast I independently visualized and interpreted imaging which showed diffuse pancolitis and the monitor tracing which showed NSR   After the interventions stated above, I reevaluated the patient and found vitals were stable, no new acute complaints.  She was wanting to go home, and her pain was very minimal.  We talked about the CT findings.  I explained this could be  infectious, or inflammatory, or potential malignancy - and due to the extensive involvement of the colon, I will refer to GI and strongly recommend f/u with them as soon as possible.  We'll treat with PO antibiotics and PO steroids for both infectious and inflammatory etiologies for colitis.  I advised a smoothie/boost/soft foot and liquid diet for at least the next week.  She verbalized understanding.   Clinical Course as of 11/19/20 1017  Fri Nov 18, 2020  2110 Unclear cause of her colitis at this time.  We will treat with antibiotics for possible infectious cause, and also treat with a course of steroids as this may be early inflammatory bowel disease.  I will refer her to GI.  As she is able to keep down food and fluids I do think it is reasonable to discharge her at home. [MT]    Clinical Course User Index [MT] Donae Kueker, Kermit Balo, MD    Final Clinical Impression(s) / ED Diagnoses Final diagnoses:  Colitis    Rx / DC Orders ED Discharge Orders         Ordered    ciprofloxacin (CIPRO) 500 MG tablet  2 times daily        11/18/20 2112    metroNIDAZOLE (FLAGYL) 500 MG tablet  3 times daily        11/18/20 2112    predniSONE (DELTASONE) 10 MG tablet  Daily        11/18/20 2112    Ambulatory referral to Gastroenterology       Comments: Possible inflammatory bowel disease - pancolitis on CT scan   11/18/20 2111           Terald Sleeper, MD 11/19/20 1017

## 2020-11-18 NOTE — Discharge Instructions (Addendum)
Your next dose of antibiotics and steroids are due tomorrow morning.  Please take the full course as directed.  You should take this with some food.  Do not drink alcohol while taking these antibiotics, as it will make you sick.  I placed a referral to our GI group.  If you do not hear from them on Monday, call to schedule an appointment with them.  You should stick to a liquid diet if possible for the next 7 days, consider smoothies or boost or Ensure.

## 2020-11-18 NOTE — ED Notes (Signed)
Patient transported to CT 

## 2020-11-18 NOTE — ED Triage Notes (Signed)
Pt states for the past month she was having diarrhea, which then switched to constipation 2 weeks ago. Complains of lower abdominal pain. States there is blood in her stool and she feels like she needs to have BM but only a small amount comes out about 10 times a day.

## 2020-11-19 ENCOUNTER — Telehealth (HOSPITAL_BASED_OUTPATIENT_CLINIC_OR_DEPARTMENT_OTHER): Payer: Self-pay | Admitting: Emergency Medicine

## 2020-11-23 ENCOUNTER — Ambulatory Visit: Payer: Medicaid Other

## 2020-11-30 ENCOUNTER — Ambulatory Visit (INDEPENDENT_AMBULATORY_CARE_PROVIDER_SITE_OTHER): Payer: BC Managed Care – PPO

## 2020-11-30 ENCOUNTER — Other Ambulatory Visit: Payer: Self-pay

## 2020-11-30 ENCOUNTER — Other Ambulatory Visit (HOSPITAL_COMMUNITY)
Admission: RE | Admit: 2020-11-30 | Discharge: 2020-11-30 | Disposition: A | Discharge status: Home / Self Care | Payer: BC Managed Care – PPO | Source: Ambulatory Visit | Attending: Family Medicine | Admitting: Family Medicine

## 2020-11-30 VITALS — BP 98/65 | HR 106

## 2020-11-30 DIAGNOSIS — B373 Candidiasis of vulva and vagina: Secondary | ICD-10-CM | POA: Insufficient documentation

## 2020-11-30 DIAGNOSIS — N949 Unspecified condition associated with female genital organs and menstrual cycle: Secondary | ICD-10-CM | POA: Insufficient documentation

## 2020-11-30 DIAGNOSIS — Z3042 Encounter for surveillance of injectable contraceptive: Secondary | ICD-10-CM

## 2020-11-30 DIAGNOSIS — N760 Acute vaginitis: Secondary | ICD-10-CM

## 2020-11-30 DIAGNOSIS — Z113 Encounter for screening for infections with a predominantly sexual mode of transmission: Secondary | ICD-10-CM | POA: Diagnosis not present

## 2020-11-30 DIAGNOSIS — B9689 Other specified bacterial agents as the cause of diseases classified elsewhere: Secondary | ICD-10-CM

## 2020-11-30 MED ORDER — MEDROXYPROGESTERONE ACETATE 150 MG/ML IM SUSP
150.0000 mg | Freq: Once | INTRAMUSCULAR | Status: AC
  Administered 2020-11-30: 150 mg via INTRAMUSCULAR

## 2020-11-30 NOTE — Progress Notes (Signed)
Attestation of Attending Supervision of clinical support staff: I agree with the care provided to this patient and was available for any consultation.  I have reviewed the CMA's note and chart, and I agree with the management and plan.  Jahmal Dunavant Niles Trey Bebee, MD, MPH, ABFM Attending Physician [facility]  

## 2020-11-30 NOTE — Progress Notes (Signed)
Date last pap: 09/07/2020 Last Depo-Provera: 09/07/2020 Side Effects if any: None Serum HCG indicated? NA Depo-Provera 150 mg IM given by: Philemon Kingdom, CMA Next appointment due: March 16th - March 30th, 2022     SUBJECTIVE:  19 y.o. female complains of vaginal burning x3days. Denies abnormal vaginal bleeding or significant pelvic pain or fever. No UTI symptoms. Denies history of known exposure to STD.  Patient's last menstrual period was 11/17/2020.  OBJECTIVE:  She appears well, afebrile. Urine dipstick: not done.  ASSESSMENT:  Vaginal Burning   PLAN:  GC, chlamydia, trichomonas, BVAG, CVAG probe sent to lab. Treatment: To be determined once lab results are received ROV prn if symptoms persist or worsen.

## 2020-12-04 LAB — CERVICOVAGINAL ANCILLARY ONLY
Bacterial Vaginitis (gardnerella): NEGATIVE
Candida Glabrata: NEGATIVE
Candida Vaginitis: POSITIVE — AB
Chlamydia: NEGATIVE
Comment: NEGATIVE
Comment: NEGATIVE
Comment: NEGATIVE
Comment: NEGATIVE
Comment: NEGATIVE
Comment: NORMAL
Neisseria Gonorrhea: NEGATIVE
Trichomonas: NEGATIVE

## 2020-12-13 ENCOUNTER — Telehealth: Payer: Self-pay | Admitting: *Deleted

## 2020-12-13 MED ORDER — FLUCONAZOLE 150 MG PO TABS: 150.0000 mg | ORAL_TABLET | Freq: Once | ORAL | 3 refills | Status: AC

## 2020-12-13 NOTE — Telephone Encounter (Signed)
Pt called and left message requesting a pill RX for yeast, as the cream is not helping. Will send in per protocol.

## 2021-02-15 ENCOUNTER — Ambulatory Visit: Payer: Medicaid Other

## 2021-02-17 ENCOUNTER — Ambulatory Visit: Payer: BC Managed Care – PPO | Admitting: Nurse Practitioner

## 2021-02-23 ENCOUNTER — Ambulatory Visit (INDEPENDENT_AMBULATORY_CARE_PROVIDER_SITE_OTHER): Payer: Medicaid Other

## 2021-02-23 ENCOUNTER — Other Ambulatory Visit: Payer: Self-pay

## 2021-02-23 ENCOUNTER — Telehealth: Payer: Self-pay

## 2021-02-23 VITALS — BP 115/75 | HR 85

## 2021-02-23 DIAGNOSIS — Z3042 Encounter for surveillance of injectable contraceptive: Secondary | ICD-10-CM | POA: Diagnosis not present

## 2021-02-23 MED ORDER — MEDROXYPROGESTERONE ACETATE 150 MG/ML IM SUSP
150.0000 mg | Freq: Once | INTRAMUSCULAR | Status: AC
  Administered 2021-02-23: 150 mg via INTRAMUSCULAR

## 2021-02-23 NOTE — Progress Notes (Signed)
Patient seen and assessed by nursing staff.  Agree with documentation and plan.  

## 2021-02-23 NOTE — Progress Notes (Signed)
Date last pap: 09/07/2020 Last Depo-Provera: 11/30/2020 Side Effects if any: None Serum HCG indicated? NA Depo-Provera 150 mg IM given by: Philemon Kingdom, CMA Next appointment due: June 9th - June 23rd, 2022

## 2021-02-23 NOTE — Telephone Encounter (Signed)
Pt left v/m that she has appt on 03/01/21 about ingroin hair and inflammation in bikini area. Now pt seeing blood and pus drainage and wants to know if can get abx. Pt has appt with Luna Kitchens at [facility] on [facility]. I spoke with pt and gave pt correct phone # to call [phone]. Pt voiced understanding and will call office has appt with.

## 2021-03-01 ENCOUNTER — Ambulatory Visit (INDEPENDENT_AMBULATORY_CARE_PROVIDER_SITE_OTHER): Payer: Self-pay | Admitting: Student

## 2021-03-01 ENCOUNTER — Other Ambulatory Visit: Payer: Self-pay

## 2021-03-01 ENCOUNTER — Encounter: Payer: Self-pay | Admitting: Student

## 2021-03-01 VITALS — BP 123/83 | HR 97

## 2021-03-01 DIAGNOSIS — N898 Other specified noninflammatory disorders of vagina: Secondary | ICD-10-CM

## 2021-03-01 NOTE — Progress Notes (Signed)
  History:  [name] is a 20 y.o. G0P0000 who presents to clinic today for evaluation of bump on her mons pubis and some pain on her left upper inner thigh. States she has an ingrown hair that always "flares up" after shaving but that she also noticed some blisters on her left thigh. She applied tea tree oil to them and they dried up but she still wants to make sure that she doesn't need an antibiotic.   The following portions of the patient's history were reviewed and updated as appropriate: allergies, current medications, family history, past medical history, social history, past surgical history and problem list.  Review of Systems:  Review of Systems  Constitutional: Negative.   HENT: Negative.   Respiratory: Negative.   Cardiovascular: Negative.   Genitourinary: Negative.   Skin: Negative.       Objective:  Physical Exam BP 123/83   Pulse 97  Physical Exam Constitutional:      Appearance: Normal appearance.  HENT:     Head: Normocephalic.  Cardiovascular:     Rate and Rhythm: Normal rate.  Genitourinary:    Comments: Small vesical on left labia near pubic symphsis; left upper inner thigh has no vesicles but feels slightly tender to palpation.  Neurological:     Mental Status: She is alert.       Labs and Imaging No results found for this or any previous visit (from the past 24 hour(s)).  No results found.   Assessment & Plan:   1. Vaginal lesion   -HSV culture collected -advised patient that she does not need an antibiotic, but recommended that she stop using razors and let area heal -Do not try to pop vesicle but can continue to put tea tree oil and hot compresses on it -Avoid intercourse until results are back on HSV    Approximately 15 minutes of total time was spent with this patient on care and coordination of care.  Marylene Land, CNM 03/01/2021 3:56 PM

## 2021-03-03 LAB — HERPES SIMPLEX VIRUS CULTURE

## 2021-03-27 ENCOUNTER — Other Ambulatory Visit (HOSPITAL_COMMUNITY)
Admission: RE | Admit: 2021-03-27 | Discharge: 2021-03-27 | Disposition: A | Discharge status: Home / Self Care | Payer: Medicaid Other | Source: Ambulatory Visit | Attending: Obstetrics & Gynecology | Admitting: Obstetrics & Gynecology

## 2021-03-27 ENCOUNTER — Other Ambulatory Visit: Payer: Self-pay

## 2021-03-27 ENCOUNTER — Ambulatory Visit (INDEPENDENT_AMBULATORY_CARE_PROVIDER_SITE_OTHER): Payer: Self-pay | Admitting: *Deleted

## 2021-03-27 VITALS — BP 106/72 | HR 75

## 2021-03-27 DIAGNOSIS — Z113 Encounter for screening for infections with a predominantly sexual mode of transmission: Secondary | ICD-10-CM | POA: Insufficient documentation

## 2021-03-27 NOTE — Progress Notes (Signed)
Patient was assessed and managed by nursing staff during this encounter. I have reviewed the chart and agree with the documentation and plan. I have also made any necessary editorial changes.  Jaynie Collins, MD 03/27/2021 8:44 AM

## 2021-03-27 NOTE — Progress Notes (Signed)
SUBJECTIVE:  20 y.o. female here for STI screen.Denies abnormal vaginal bleeding or significant pelvic pain or fever. No UTI symptoms. Denies history of known exposure to STD.  No LMP recorded.  OBJECTIVE:  She appears well, afebrile. Urine dipstick: not done.  ASSESSMENT:  STI screen   PLAN:  GC, chlamydia, and trichomonas  probe sent to lab. Treatment: To be determined once lab results are received ROV prn if symptoms persist or worsen.

## 2021-03-28 LAB — CERVICOVAGINAL ANCILLARY ONLY
Chlamydia: NEGATIVE
Comment: NEGATIVE
Comment: NEGATIVE
Comment: NORMAL
Neisseria Gonorrhea: NEGATIVE
Trichomonas: NEGATIVE

## 2021-05-15 ENCOUNTER — Ambulatory Visit: Payer: Medicaid Other

## 2021-05-22 ENCOUNTER — Ambulatory Visit: Payer: Medicaid Other

## 2021-08-22 ENCOUNTER — Encounter: Payer: Self-pay | Admitting: Radiology

## 2021-09-07 ENCOUNTER — Ambulatory Visit: Payer: Medicaid Other

## 2021-10-03 ENCOUNTER — Ambulatory Visit: Payer: Medicaid Other | Admitting: Obstetrics and Gynecology

## 2021-11-08 ENCOUNTER — Emergency Department (HOSPITAL_BASED_OUTPATIENT_CLINIC_OR_DEPARTMENT_OTHER): Payer: Self-pay

## 2021-11-08 ENCOUNTER — Other Ambulatory Visit: Payer: Self-pay

## 2021-11-08 ENCOUNTER — Encounter (HOSPITAL_BASED_OUTPATIENT_CLINIC_OR_DEPARTMENT_OTHER): Payer: Self-pay | Admitting: Emergency Medicine

## 2021-11-08 ENCOUNTER — Inpatient Hospital Stay (HOSPITAL_BASED_OUTPATIENT_CLINIC_OR_DEPARTMENT_OTHER)
Admission: EM | Admit: 2021-11-08 | Discharge: 2021-11-12 | DRG: 386 | Disposition: A | Discharge status: Home / Self Care | Payer: Self-pay | Attending: Internal Medicine | Admitting: Internal Medicine

## 2021-11-08 DIAGNOSIS — R8271 Bacteriuria: Secondary | ICD-10-CM | POA: Diagnosis present

## 2021-11-08 DIAGNOSIS — Z79899 Other long term (current) drug therapy: Secondary | ICD-10-CM

## 2021-11-08 DIAGNOSIS — Z20822 Contact with and (suspected) exposure to covid-19: Secondary | ICD-10-CM | POA: Diagnosis present

## 2021-11-08 DIAGNOSIS — Z681 Body mass index (BMI) 19 or less, adult: Secondary | ICD-10-CM

## 2021-11-08 DIAGNOSIS — R6881 Early satiety: Secondary | ICD-10-CM | POA: Diagnosis present

## 2021-11-08 DIAGNOSIS — K922 Gastrointestinal hemorrhage, unspecified: Secondary | ICD-10-CM

## 2021-11-08 DIAGNOSIS — E871 Hypo-osmolality and hyponatremia: Secondary | ICD-10-CM | POA: Diagnosis present

## 2021-11-08 DIAGNOSIS — D649 Anemia, unspecified: Secondary | ICD-10-CM

## 2021-11-08 DIAGNOSIS — E440 Moderate protein-calorie malnutrition: Secondary | ICD-10-CM | POA: Diagnosis present

## 2021-11-08 DIAGNOSIS — D509 Iron deficiency anemia, unspecified: Secondary | ICD-10-CM | POA: Diagnosis present

## 2021-11-08 DIAGNOSIS — K529 Noninfective gastroenteritis and colitis, unspecified: Secondary | ICD-10-CM

## 2021-11-08 DIAGNOSIS — K5100 Ulcerative (chronic) pancolitis without complications: Principal | ICD-10-CM | POA: Diagnosis present

## 2021-11-08 HISTORY — DX: Noninfective gastroenteritis and colitis, unspecified: K52.9

## 2021-11-08 LAB — CBC WITH DIFFERENTIAL/PLATELET
Abs Immature Granulocytes: 0.1 10*3/uL — ABNORMAL HIGH (ref 0.00–0.07)
Basophils Absolute: 0 10*3/uL (ref 0.0–0.1)
Basophils Relative: 0 %
Eosinophils Absolute: 0.2 10*3/uL (ref 0.0–0.5)
Eosinophils Relative: 2 %
HCT: 15.8 % — ABNORMAL LOW (ref 36.0–46.0)
Hemoglobin: 4.1 g/dL — CL (ref 12.0–15.0)
Immature Granulocytes: 1 %
Lymphocytes Relative: 10 %
Lymphs Abs: 1.5 10*3/uL (ref 0.7–4.0)
MCH: 16.3 pg — ABNORMAL LOW (ref 26.0–34.0)
MCHC: 25.9 g/dL — ABNORMAL LOW (ref 30.0–36.0)
MCV: 62.9 fL — ABNORMAL LOW (ref 80.0–100.0)
Monocytes Absolute: 1.5 10*3/uL — ABNORMAL HIGH (ref 0.1–1.0)
Monocytes Relative: 10 %
Neutro Abs: 11.9 10*3/uL — ABNORMAL HIGH (ref 1.7–7.7)
Neutrophils Relative %: 77 %
Platelets: 804 10*3/uL — ABNORMAL HIGH (ref 150–400)
RBC: 2.51 MIL/uL — ABNORMAL LOW (ref 3.87–5.11)
RDW: 20 % — ABNORMAL HIGH (ref 11.5–15.5)
WBC: 15.3 10*3/uL — ABNORMAL HIGH (ref 4.0–10.5)
nRBC: 0.2 % (ref 0.0–0.2)

## 2021-11-08 LAB — COMPREHENSIVE METABOLIC PANEL
ALT: 7 U/L (ref 0–44)
AST: 10 U/L — ABNORMAL LOW (ref 15–41)
Albumin: 2.4 g/dL — ABNORMAL LOW (ref 3.5–5.0)
Alkaline Phosphatase: 61 U/L (ref 38–126)
Anion gap: 10 (ref 5–15)
BUN: 8 mg/dL (ref 6–20)
CO2: 23 mmol/L (ref 22–32)
Calcium: 8.2 mg/dL — ABNORMAL LOW (ref 8.9–10.3)
Chloride: 99 mmol/L (ref 98–111)
Creatinine, Ser: 0.56 mg/dL (ref 0.44–1.00)
GFR, Estimated: >60 mL/min (ref >60)
Glucose, Bld: 109 mg/dL — ABNORMAL HIGH (ref 70–99)
Potassium: 3.6 mmol/L (ref 3.5–5.1)
Sodium: 132 mmol/L — ABNORMAL LOW (ref 135–145)
Total Bilirubin: 0.2 mg/dL — ABNORMAL LOW (ref 0.3–1.2)
Total Protein: 7.5 g/dL (ref 6.5–8.1)

## 2021-11-08 LAB — IRON AND TIBC
Iron: 10 ug/dL — ABNORMAL LOW (ref 28–170)
Iron: <5 ug/dL — ABNORMAL LOW (ref 28–170)
Saturation Ratios: 4 % — ABNORMAL LOW (ref 10.4–31.8)
Saturation Ratios: 1 % — ABNORMAL LOW (ref 10.4–31.8)
TIBC: 247 ug/dL — ABNORMAL LOW (ref 250–450)
TIBC: 224 ug/dL — ABNORMAL LOW (ref 250–450)
UIBC: 237 ug/dL
UIBC: 221 ug/dL

## 2021-11-08 LAB — GASTROINTESTINAL PANEL BY PCR, STOOL (REPLACES STOOL CULTURE)

## 2021-11-08 LAB — URINALYSIS, ROUTINE W REFLEX MICROSCOPIC
Bilirubin Urine: NEGATIVE
Glucose, UA: NEGATIVE mg/dL
Hgb urine dipstick: NEGATIVE
Ketones, ur: 80 mg/dL — AB
Leukocytes,Ua: NEGATIVE
Nitrite: POSITIVE — AB
Protein, ur: 30 mg/dL — AB
Specific Gravity, Urine: 1.025 (ref 1.005–1.030)
pH: 6 (ref 5.0–8.0)

## 2021-11-08 LAB — OCCULT BLOOD X 1 CARD TO LAB, STOOL: Fecal Occult Bld: NEGATIVE

## 2021-11-08 LAB — HIV ANTIBODY (ROUTINE TESTING W REFLEX): HIV Screen 4th Generation wRfx: NONREACTIVE

## 2021-11-08 LAB — PROTIME-INR
INR: 1.1 (ref 0.8–1.2)
Prothrombin Time: 14.5 seconds (ref 11.4–15.2)

## 2021-11-08 LAB — RESP PANEL BY RT-PCR (FLU A&B, COVID) ARPGX2
Influenza A by PCR: NEGATIVE
Influenza B by PCR: NEGATIVE
SARS Coronavirus 2 by RT PCR: NEGATIVE

## 2021-11-08 LAB — URINALYSIS, MICROSCOPIC (REFLEX)

## 2021-11-08 LAB — PREPARE RBC (CROSSMATCH)

## 2021-11-08 LAB — TSH: TSH: 0.517 u[IU]/mL (ref 0.350–4.500)

## 2021-11-08 LAB — MAGNESIUM: Magnesium: 1.9 mg/dL (ref 1.7–2.4)

## 2021-11-08 LAB — HEMOGLOBIN AND HEMATOCRIT, BLOOD
HCT: 13.1 % — ABNORMAL LOW (ref 36.0–46.0)
Hemoglobin: 3.3 g/dL — CL (ref 12.0–15.0)

## 2021-11-08 LAB — PREGNANCY, URINE: Preg Test, Ur: NEGATIVE

## 2021-11-08 LAB — ABO/RH: ABO/RH(D): O POS

## 2021-11-08 LAB — LIPASE, BLOOD: Lipase: 19 U/L (ref 11–51)

## 2021-11-08 MED ORDER — ACETAMINOPHEN 325 MG PO TABS
650.0000 mg | ORAL_TABLET | Freq: Four times a day (QID) | ORAL | Status: DC | PRN
  Administered 2021-11-08: 650 mg via ORAL
  Filled 2021-11-08: qty 2

## 2021-11-08 MED ORDER — SODIUM CHLORIDE 0.9 % IV BOLUS
1000.0000 mL | Freq: Once | INTRAVENOUS | Status: AC
  Administered 2021-11-08: 1000 mL via INTRAVENOUS

## 2021-11-08 MED ORDER — METRONIDAZOLE 500 MG/100ML IV SOLN
500.0000 mg | Freq: Two times a day (BID) | INTRAVENOUS | Status: DC
  Administered 2021-11-08: 500 mg via INTRAVENOUS
  Filled 2021-11-08: qty 100

## 2021-11-08 MED ORDER — SODIUM CHLORIDE 0.9% IV SOLUTION: Freq: Once | INTRAVENOUS | Status: AC

## 2021-11-08 MED ORDER — IOHEXOL 300 MG/ML  SOLN
100.0000 mL | Freq: Once | INTRAMUSCULAR | Status: AC | PRN
  Administered 2021-11-08: 100 mL via INTRAVENOUS

## 2021-11-08 NOTE — ED Triage Notes (Signed)
For 2 weeks , dizziness , heart palpitation , weigh loss , dry mouth , loss of appetite . Hx colitis

## 2021-11-08 NOTE — Progress Notes (Addendum)
New admission transfer from [facility] via [facility].  She appears to be comfortable with no sx/sx of respiratory/cardiac discomfort and asking if she can go to bathroom.   Pt ambulated to bathroom and oriented on how to use call string for assistance getting back to bed.

## 2021-11-08 NOTE — Progress Notes (Signed)
Notified by EDP of need for admission d/t symptomatic anemia. [facility] accepts patient to tele at [facility]. EDP is to remain responsible for orders/medical decisions while patient is holding at [facility]. Upon arrival to [facility], [facility] will assume care. Nursing staff will call patient placement to notify them of patient's arrival so that the proper [facility] member may receive the patient. Nursing staff will notify the following consultants, [facility] (Dr. Dulce Sellar), of patient's arrival for their evaluation. Thank you.

## 2021-11-08 NOTE — Progress Notes (Signed)
   11/08/21 1427  Assess: MEWS Score  Temp 98.7 F (37.1 C)  BP 114/74  Pulse Rate (!) 111  Resp 20  Level of Consciousness Alert  SpO2 100 %  O2 Device Room Air  Patient Activity (if Appropriate) In bed  Assess: MEWS Score  MEWS Temp 0  MEWS Systolic 0  MEWS Pulse 2  MEWS RR 0  MEWS LOC 0  MEWS Score 2  MEWS Score Color Yellow  Assess: if the MEWS score is Yellow or Red  Were vital signs taken at a resting state? Yes  Focused Assessment No change from prior assessment  Does the patient meet 2 or more of the SIRS criteria? Yes  Does the patient have a confirmed or suspected source of infection? No  MEWS guidelines implemented *See Row Information* Yes  Treat  MEWS Interventions Escalated (See documentation below)  Pain Scale 0-10  Pain Score 0  Take Vital Signs  Increase Vital Sign Frequency  Yellow: Q 2hr X 2 then Q 4hr X 2, if remains yellow, continue Q 4hrs  Escalate  MEWS: Escalate Yellow: discuss with charge nurse/RN and consider discussing with provider and RRT  Notify: Charge Nurse/RN  Name of Charge Nurse/RN Notified RN Britta Mccreedy  Date Charge Nurse/RN Notified 11/08/21  Time Charge Nurse/RN Notified 1430  Notify: Provider  Provider Name/Title MD Tyrone  Date Provider Notified 11/08/21  Time Provider Notified 1427  Notification Type Page  Notification Reason Other (Comment) (new admit, yellow mews)  Provider response At bedside  Date of Provider Response 11/08/21  Time of Provider Response 1430  Assess: SIRS CRITERIA  SIRS Temperature  0  SIRS Pulse 1  SIRS Respirations  0  SIRS WBC 0  SIRS Score Sum  1   New admission from [facility] with anemia and very low hgb. GI doc and admitting MD Ronaldo Miyamoto aware of admission. GI doc at bedside and MD Ronaldo Miyamoto coming to see pt.

## 2021-11-08 NOTE — Progress Notes (Signed)
   11/08/21 1643  Assess: MEWS Score  Temp 99.3 F (37.4 C)  BP 124/75  Pulse Rate (!) 132  Resp (!) 21  SpO2 100 %  O2 Device Room Air  Patient Activity (if Appropriate) Other (Comment) (back from BR)  Assess: MEWS Score  MEWS Temp 0  MEWS Systolic 0  MEWS Pulse 3  MEWS RR 1  MEWS LOC 0  MEWS Score 4  MEWS Score Color Red  Assess: if the MEWS score is Yellow or Red  Were vital signs taken at a resting state? No  Focused Assessment No change from prior assessment  Does the patient meet 2 or more of the SIRS criteria? Yes  Does the patient have a confirmed or suspected source of infection? No  MEWS guidelines implemented *See Row Information* Yes  Treat  MEWS Interventions Other (Comment) (started 2L O2 via [place])  Pain Scale 0-10  Pain Score 0  Take Vital Signs  Increase Vital Sign Frequency  Red: Q 1hr X 4 then Q 4hr X 4, if remains red, continue Q 4hrs  Escalate  MEWS: Escalate Red: discuss with charge nurse/RN and provider, consider discussing with RRT  Notify: Charge Nurse/RN  Name of Charge Nurse/RN Notified RN Britta Mccreedy  Date Charge Nurse/RN Notified 11/08/21  Time Charge Nurse/RN Notified 1643  Notify: Provider  Provider Name/Title MD Ronaldo Miyamoto  Date Provider Notified 11/08/21  Time Provider Notified 1729  Notification Type Page  Notification Reason Change in status  Provider response See new orders  Date of Provider Response 11/08/21  Time of Provider Response 1730  Notify: Rapid Response  Name of Rapid Response RN Notified RN Christian  Date Rapid Response Notified 11/08/21  Time Rapid Response Notified 1645  Assess: SIRS CRITERIA  SIRS Temperature  0  SIRS Pulse 1  SIRS Respirations  1  SIRS WBC 0  SIRS Score Sum  2   Escalated mews d/t elevated heart rate and temp. MD Ronaldo Miyamoto made aware and new orders received. Charge RN Britta Mccreedy made aware of change in MEWS and new orders received.

## 2021-11-08 NOTE — H&P (View-Only) (Signed)
[facility] Consultation Note  Referring Provider: [facility] Primary Care Physician:  Patient, No Pcp Per (Inactive)  Reason for Consultation:  bloody diarrhea  HPI: Nakya G Haque is a 20 y.o. female admitted with weakness, dizziness, bloody diarrhea.  Ongoing intermittent bloody diarrhea for over one year.  Had similar episode which resolved with antibiotics and steroids one year ago.  No prior endoscopy, colonoscopy.  No family history of GI disease.   Past Medical History:  Diagnosis Date   Colitis     Past Surgical History:  Procedure Laterality Date   FEMUR CLOSED REDUCTION      Prior to Admission medications   Medication Sig Start Date End Date Taking? Authorizing Provider  cephALEXin (KEFLEX) 500 MG capsule Take 1 capsule (500 mg total) by mouth 4 (four) times daily. Patient not taking: Reported on 08/05/2019 02/29/16   Westfall, Elizabeth C, PA-C  medroxyPROGESTERone (DEPO-PROVERA) 150 MG/ML injection Inject 1 mL (150 mg total) into the muscle every 3 (three) months. 08/05/19   Weinhold, Samantha C, CNM  medroxyPROGESTERone (DEPO-PROVERA) 150 MG/ML injection Inject 1 mL (150 mg total) into the muscle every 3 (three) months. 09/07/20   Kooistra, Kathryn Lorraine, CNM    No current facility-administered medications for this encounter.    Allergies as of 11/08/2021   (No Known Allergies)    No family history on file.  Social History   Socioeconomic History   Marital status: Single    Spouse name: Not on file   Number of children: Not on file   Years of education: Not on file   Highest education level: Not on file  Occupational History   Not on file  Tobacco Use   Smoking status: Never   Smokeless tobacco: Never  Vaping Use   Vaping Use: Never used  Substance and Sexual Activity   Alcohol use: Yes    Comment: occassional   Drug use: Yes    Types: Marijuana   Sexual activity: Yes    Birth control/protection: Injection  Other Topics Concern   Not on file   Social History Narrative   Not on file   Social Determinants of Health   Financial Resource Strain: Not on file  Food Insecurity: Not on file  Transportation Needs: Not on file  Physical Activity: Not on file  Stress: Not on file  Social Connections: Not on file  Intimate Partner Violence: Not on file    Review of Systems: As per HPI, all others negative.  Physical Exam: Vital signs in last 24 hours: Temp:  [98.2 F (36.8 C)-98.7 F (37.1 C)] 98.7 F (37.1 C) (12/07 1427) Pulse Rate:  [108-132] 111 (12/07 1427) Resp:  [13-20] 20 (12/07 1427) BP: (107-119)/(71-87) 114/74 (12/07 1427) SpO2:  [98 %-100 %] 100 % (12/07 1427) Weight:  [45.5 kg] 45.5 kg (12/07 0731)   General:   Alert, thin, Well-developed, well-nourished, pleasant and cooperative in NAD Head:  Normocephalic and atraumatic. Eyes:  Sclera clear, no icterus.   Conjunctiva pale Ears:  Normal auditory acuity. Nose:  No deformity, discharge,  or lesions. Mouth:  No deformity or lesions.  Oropharynx pale and dry Neck:  Supple; no masses or thyromegaly. Lungs:  Clear throughout to auscultation.   No wheezes, crackles, or rhonchi. No acute distress. Heart:  Regular rate and rhythm; no murmurs, clicks, rubs,  or gallops. Abdomen:  Soft, thin, mild diffuse tenderness without peritonitis and nondistended. No masses, hepatosplenomegaly or hernias noted. Normal bowel sounds, without guarding, and without rebound.       Msk:  Symmetrical without gross deformities. Normal posture. Pulses:  Normal pulses noted. Extremities:  Without clubbing or edema. Neurologic:  Alert and  oriented x4;  grossly normal neurologically. Skin:  Intact without significant lesions or rashes. Psych:  Alert and cooperative. Normal mood and affect.   Lab Results: Recent Labs    11/08/21 0742  WBC 15.3*  HGB 4.1*  HCT 15.8*  PLT 804*   BMET Recent Labs    11/08/21 0742  NA 132*  K 3.6  CL 99  CO2 23  GLUCOSE 109*  BUN 8  CREATININE  0.56  CALCIUM 8.2*   LFT Recent Labs    11/08/21 0742  PROT 7.5  ALBUMIN 2.4*  AST 10*  ALT 7  ALKPHOS 61  BILITOT 0.2*   PT/INR Recent Labs    11/08/21 0743  LABPROT 14.5  INR 1.1    Studies/Results: CT Abdomen Pelvis W Contrast  Result Date: 11/08/2021 CLINICAL DATA:  Bloody diarrhea, inflammatory bowel disease, dizziness EXAM: CT ABDOMEN AND PELVIS WITH CONTRAST TECHNIQUE: Multidetector CT imaging of the abdomen and pelvis was performed using the standard protocol following bolus administration of intravenous contrast. CONTRAST:  OMNIPAQUE IOHEXOL 300 MG/ML  SOLN COMPARISON:  11/18/2020 FINDINGS: Lower chest: Unremarkable. Hepatobiliary: No focal abnormality is seen in the liver. Liver measures 15.5 cm in length. There is no dilation of bile ducts. Gallbladder is not distended. Pancreas: No focal abnormality is seen. Spleen: Unremarkable. Adrenals/Urinary Tract: Adrenals are not enlarged. There is no hydronephrosis. There are no renal or ureteral stones. Urinary bladder is not distended. Stomach/Bowel: Stomach is unremarkable. Small bowel loops are not dilated. Oral contrast has reached the right colon. There is no significant wall thickening in the small bowel loops. There is no focal pericecal inflammation. There is 6 mm tubular structure posterior to the cecum, most likely normal appendix. There is abnormal diffuse wall thickening in the ascending colon transverse colon, descending colon and rectosigmoid. There is fluid in the lumen of rectosigmoid. There is no loculated pericolic fluid collection. Vascular/Lymphatic: Unremarkable. Reproductive: Unremarkable. Other: There is small amount of free fluid in the pelvis. There is no pneumoperitoneum. Musculoskeletal: Unremarkable. IMPRESSION: There is diffuse wall thickening in the colon and rectum suggesting inflammatory or infectious colitis. There is no loculated pericolic fluid collection. There is no evidence of intestinal  obstruction or pneumoperitoneum. There is no hydronephrosis. Small amount of free fluid in pelvis may be due to nonspecific colitis. There is no loculated pericolic fluid collection. Electronically Signed   By: Ernie Avena M.D.   On: 11/08/2021 11:49    Impression:   Recurrent pancolitis and diarrhea.  Suspect ulcerative colitis.  Blood in stool.  Anemia, profound, microcytosis.  Plan:   Stool studies.  Blood transfusion for Hgb goal >/= 7.5.  If stool studies unrevealing, will need sigmoidoscopy this admission.  [facility] will follow.   LOS: 0 days   Mikhia Dusek M  11/08/2021, 2:48 PM  Cell [phone] If no answer or after 5 PM call [phone]

## 2021-11-08 NOTE — Consult Note (Signed)
[facility] Consultation Note  Referring Provider: [facility] Primary Care Physician:  Patient, No Pcp Per (Inactive)  Reason for Consultation:  bloody diarrhea  HPI: Mackenzie Rios is a 20 y.o. female admitted with weakness, dizziness, bloody diarrhea.  Ongoing intermittent bloody diarrhea for over one year.  Had similar episode which resolved with antibiotics and steroids one year ago.  No prior endoscopy, colonoscopy.  No family history of GI disease.   Past Medical History:  Diagnosis Date   Colitis     Past Surgical History:  Procedure Laterality Date   FEMUR CLOSED REDUCTION      Prior to Admission medications   Medication Sig Start Date End Date Taking? Authorizing Provider  cephALEXin (KEFLEX) 500 MG capsule Take 1 capsule (500 mg total) by mouth 4 (four) times daily. Patient not taking: Reported on 08/05/2019 02/29/16   Mady Gemma, PA-C  medroxyPROGESTERone (DEPO-PROVERA) 150 MG/ML injection Inject 1 mL (150 mg total) into the muscle every 3 (three) months. 08/05/19   Calvert Cantor, CNM  medroxyPROGESTERone (DEPO-PROVERA) 150 MG/ML injection Inject 1 mL (150 mg total) into the muscle every 3 (three) months. 09/07/20   Marylene Land, CNM    No current facility-administered medications for this encounter.    Allergies as of 11/08/2021   (No Known Allergies)    No family history on file.  Social History   Socioeconomic History   Marital status: Single    Spouse name: Not on file   Number of children: Not on file   Years of education: Not on file   Highest education level: Not on file  Occupational History   Not on file  Tobacco Use   Smoking status: Never   Smokeless tobacco: Never  Vaping Use   Vaping Use: Never used  Substance and Sexual Activity   Alcohol use: Yes    Comment: occassional   Drug use: Yes    Types: Marijuana   Sexual activity: Yes    Birth control/protection: Injection  Other Topics Concern   Not on file   Social History Narrative   Not on file   Social Determinants of Health   Financial Resource Strain: Not on file  Food Insecurity: Not on file  Transportation Needs: Not on file  Physical Activity: Not on file  Stress: Not on file  Social Connections: Not on file  Intimate Partner Violence: Not on file    Review of Systems: As per HPI, all others negative.  Physical Exam: Vital signs in last 24 hours: Temp:  [98.2 F (36.8 C)-98.7 F (37.1 C)] 98.7 F (37.1 C) (12/07 1427) Pulse Rate:  [108-132] 111 (12/07 1427) Resp:  [13-20] 20 (12/07 1427) BP: (107-119)/(71-87) 114/74 (12/07 1427) SpO2:  [98 %-100 %] 100 % (12/07 1427) Weight:  [45.5 kg] 45.5 kg (12/07 0731)   General:   Alert, thin, Well-developed, well-nourished, pleasant and cooperative in NAD Head:  Normocephalic and atraumatic. Eyes:  Sclera clear, no icterus.   Conjunctiva pale Ears:  Normal auditory acuity. Nose:  No deformity, discharge,  or lesions. Mouth:  No deformity or lesions.  Oropharynx pale and dry Neck:  Supple; no masses or thyromegaly. Lungs:  Clear throughout to auscultation.   No wheezes, crackles, or rhonchi. No acute distress. Heart:  Regular rate and rhythm; no murmurs, clicks, rubs,  or gallops. Abdomen:  Soft, thin, mild diffuse tenderness without peritonitis and nondistended. No masses, hepatosplenomegaly or hernias noted. Normal bowel sounds, without guarding, and without rebound.  Msk:  Symmetrical without gross deformities. Normal posture. Pulses:  Normal pulses noted. Extremities:  Without clubbing or edema. Neurologic:  Alert and  oriented x4;  grossly normal neurologically. Skin:  Intact without significant lesions or rashes. Psych:  Alert and cooperative. Normal mood and affect.   Lab Results: Recent Labs    11/08/21 0742  WBC 15.3*  HGB 4.1*  HCT 15.8*  PLT 804*   BMET Recent Labs    11/08/21 0742  NA 132*  K 3.6  CL 99  CO2 23  GLUCOSE 109*  BUN 8  CREATININE  0.56  CALCIUM 8.2*   LFT Recent Labs    11/08/21 0742  PROT 7.5  ALBUMIN 2.4*  AST 10*  ALT 7  ALKPHOS 61  BILITOT 0.2*   PT/INR Recent Labs    11/08/21 0743  LABPROT 14.5  INR 1.1    Studies/Results: CT Abdomen Pelvis W Contrast  Result Date: 11/08/2021 CLINICAL DATA:  Bloody diarrhea, inflammatory bowel disease, dizziness EXAM: CT ABDOMEN AND PELVIS WITH CONTRAST TECHNIQUE: Multidetector CT imaging of the abdomen and pelvis was performed using the standard protocol following bolus administration of intravenous contrast. CONTRAST:  OMNIPAQUE IOHEXOL 300 MG/ML  SOLN COMPARISON:  11/18/2020 FINDINGS: Lower chest: Unremarkable. Hepatobiliary: No focal abnormality is seen in the liver. Liver measures 15.5 cm in length. There is no dilation of bile ducts. Gallbladder is not distended. Pancreas: No focal abnormality is seen. Spleen: Unremarkable. Adrenals/Urinary Tract: Adrenals are not enlarged. There is no hydronephrosis. There are no renal or ureteral stones. Urinary bladder is not distended. Stomach/Bowel: Stomach is unremarkable. Small bowel loops are not dilated. Oral contrast has reached the right colon. There is no significant wall thickening in the small bowel loops. There is no focal pericecal inflammation. There is 6 mm tubular structure posterior to the cecum, most likely normal appendix. There is abnormal diffuse wall thickening in the ascending colon transverse colon, descending colon and rectosigmoid. There is fluid in the lumen of rectosigmoid. There is no loculated pericolic fluid collection. Vascular/Lymphatic: Unremarkable. Reproductive: Unremarkable. Other: There is small amount of free fluid in the pelvis. There is no pneumoperitoneum. Musculoskeletal: Unremarkable. IMPRESSION: There is diffuse wall thickening in the colon and rectum suggesting inflammatory or infectious colitis. There is no loculated pericolic fluid collection. There is no evidence of intestinal  obstruction or pneumoperitoneum. There is no hydronephrosis. Small amount of free fluid in pelvis may be due to nonspecific colitis. There is no loculated pericolic fluid collection. Electronically Signed   By: Ernie Avena M.D.   On: 11/08/2021 11:49    Impression:   Recurrent pancolitis and diarrhea.  Suspect ulcerative colitis.  Blood in stool.  Anemia, profound, microcytosis.  Plan:   Stool studies.  Blood transfusion for Hgb goal >/= 7.5.  If stool studies unrevealing, will need sigmoidoscopy this admission.  [facility] will follow.   LOS: 0 days   Milena Liggett M  11/08/2021, 2:48 PM  Cell [phone] If no answer or after 5 PM call [phone]

## 2021-11-08 NOTE — ED Provider Notes (Signed)
[facility] EMERGENCY DEPARTMENT Provider Note   CSN: [id number] Arrival date & time: 11/08/21  0719     History Chief Complaint  Patient presents with   multiple complaints    Mackenzie Rios is a 20 y.o. female.  She is complaining of 2 weeks of feeling dizzy and lightheaded, heart palpitations, losing weight, dry mouth, loss of appetite.  No sick contacts or recent travel.  Last menstrual period about 1 month ago but is irregular.  Coming off of Depo.  No known fevers headache sore throat cough chest pain abdominal pain urinary symptoms vaginal bleeding or discharge.  Has had diarrhea for over a year 5 episodes a day with some blood in it.  She was diagnosed with colitis by CT around a year ago but never followed up with GI.  The history is provided by the patient.  Weakness Severity:  Moderate Onset quality:  Gradual Duration:  2 weeks Timing:  Constant Progression:  Worsening Chronicity:  New Relieved by:  Nothing Worsened by:  Activity Ineffective treatments:  Drinking fluids Associated symptoms: diarrhea, dizziness, nausea and vomiting   Associated symptoms: no abdominal pain, no chest pain, no cough, no dysuria, no fever, no foul-smelling urine, no frequency, no headaches, no loss of consciousness, no shortness of breath, no syncope and no vision change       Past Medical History:  Diagnosis Date   Colitis     Patient Active Problem List   Diagnosis Date Noted   Depo-Provera contraceptive status 08/05/2019    Past Surgical History:  Procedure Laterality Date   FEMUR CLOSED REDUCTION       OB History     Gravida  0   Para  0   Term  0   Preterm  0   AB  0   Living  0      SAB  0   IAB  0   Ectopic  0   Multiple  0   Live Births  0           No family history on file.  Social History   Tobacco Use   Smoking status: Never   Smokeless tobacco: Never  Vaping Use   Vaping Use: Never used  Substance Use Topics   Alcohol  use: Yes    Comment: occassional   Drug use: Yes    Types: Marijuana    Home Medications Prior to Admission medications   Medication Sig Start Date End Date Taking? Authorizing Provider  cephALEXin (KEFLEX) 500 MG capsule Take 1 capsule (500 mg total) by mouth 4 (four) times daily. Patient not taking: Reported on 08/05/2019 02/29/16   Mady Gemma, PA-C  medroxyPROGESTERone (DEPO-PROVERA) 150 MG/ML injection Inject 1 mL (150 mg total) into the muscle every 3 (three) months. 08/05/19   Calvert Cantor, CNM  medroxyPROGESTERone (DEPO-PROVERA) 150 MG/ML injection Inject 1 mL (150 mg total) into the muscle every 3 (three) months. 09/07/20   Marylene Land, CNM    Allergies    Patient has no known allergies.  Review of Systems   Review of Systems  Constitutional:  Positive for unexpected weight change. Negative for fever.  HENT:  Negative for sore throat.   Eyes:  Negative for visual disturbance.  Respiratory:  Negative for cough and shortness of breath.   Cardiovascular:  Positive for palpitations. Negative for chest pain and syncope.  Gastrointestinal:  Positive for diarrhea, nausea and vomiting. Negative for abdominal pain.  Genitourinary:  Negative for dysuria and frequency.  Musculoskeletal:  Negative for neck pain.  Skin:  Negative for rash.  Neurological:  Positive for dizziness, weakness and light-headedness. Negative for loss of consciousness and headaches.   Physical Exam Updated Vital Signs BP 119/87 (BP Location: Right Arm)   Pulse (!) 132   Temp 98.2 F (36.8 C) (Oral)   Resp 18   Ht 5\' 4"  (1.626 m)   Wt 45.5 kg   LMP 10/23/2021   SpO2 98%   BMI 17.23 kg/m   Physical Exam Vitals and nursing note reviewed.  Constitutional:      General: She is not in acute distress.    Appearance: Normal appearance. She is well-developed.  HENT:     Head: Normocephalic and atraumatic.  Eyes:     Conjunctiva/sclera: Conjunctivae normal.  Cardiovascular:      Rate and Rhythm: Regular rhythm. Tachycardia present.     Heart sounds: No murmur heard. Pulmonary:     Effort: Pulmonary effort is normal. No respiratory distress.     Breath sounds: Normal breath sounds.  Abdominal:     Palpations: Abdomen is soft.     Tenderness: There is no abdominal tenderness. There is no guarding or rebound.  Musculoskeletal:        General: No swelling. Normal range of motion.     Cervical back: Neck supple.  Skin:    General: Skin is warm and dry.     Capillary Refill: Capillary refill takes less than 2 seconds.  Neurological:     General: No focal deficit present.     Mental Status: She is alert.     Sensory: No sensory deficit.     Motor: No weakness.  Psychiatric:        Mood and Affect: Mood normal.    ED Results / Procedures / Treatments   Labs (all labs ordered are listed, but only abnormal results are displayed) Labs Reviewed  COMPREHENSIVE METABOLIC PANEL - Abnormal; Notable for the following components:      Result Value   Sodium 132 (*)    Glucose, Bld 109 (*)    Calcium 8.2 (*)    Albumin 2.4 (*)    AST 10 (*)    Total Bilirubin 0.2 (*)    All other components within normal limits  CBC WITH DIFFERENTIAL/PLATELET - Abnormal; Notable for the following components:   WBC 15.3 (*)    RBC 2.51 (*)    Hemoglobin 4.1 (*)    HCT 15.8 (*)    MCV 62.9 (*)    MCH 16.3 (*)    MCHC 25.9 (*)    RDW 20.0 (*)    Platelets 804 (*)    Neutro Abs 11.9 (*)    Monocytes Absolute 1.5 (*)    Abs Immature Granulocytes 0.10 (*)    All other components within normal limits  URINALYSIS, ROUTINE W REFLEX MICROSCOPIC - Abnormal; Notable for the following components:   APPearance CLOUDY (*)    Ketones, ur 80 (*)    Protein, ur 30 (*)    Nitrite POSITIVE (*)    All other components within normal limits  URINALYSIS, MICROSCOPIC (REFLEX) - Abnormal; Notable for the following components:   Bacteria, UA MANY (*)    All other components within normal  limits  IRON AND TIBC - Abnormal; Notable for the following components:   Iron 10 (*)    TIBC 247 (*)    Saturation Ratios 4 (*)    All other  components within normal limits  RESP PANEL BY RT-PCR (FLU A&B, COVID) ARPGX2  GASTROINTESTINAL PANEL BY PCR, STOOL (REPLACES STOOL CULTURE)  URINE CULTURE  LIPASE, BLOOD  MAGNESIUM  TSH  PREGNANCY, URINE  OCCULT BLOOD X 1 CARD TO LAB, STOOL  PROTIME-INR  PATHOLOGIST SMEAR REVIEW  IRON AND TIBC  HIV ANTIBODY (ROUTINE TESTING W REFLEX)  COMPREHENSIVE METABOLIC PANEL  CBC  HEMOGLOBIN AND HEMATOCRIT, BLOOD  HEMOGLOBIN AND HEMATOCRIT, BLOOD  HEMOGLOBIN AND HEMATOCRIT, BLOOD  TYPE AND SCREEN  PREPARE RBC (CROSSMATCH)    EKG EKG Interpretation  Date/Time:  Wednesday November 08 2021 07:30:08 EST Ventricular Rate:  128 PR Interval:  71 QRS Duration: 121 QT Interval:  330 QTC Calculation: 482 R Axis:   62 Text Interpretation: Sinus tachycardia Multiple ventricular premature complexes Consider right atrial enlargement Nonspecific intraventricular conduction delay Repol abnrm suggests ischemia, diffuse leads No old tracing to compare Confirmed by Aletta Edouard [phone]) on 11/08/2021 7:39:27 AM  Radiology CT Abdomen Pelvis W Contrast  Result Date: 11/08/2021 CLINICAL DATA:  Bloody diarrhea, inflammatory bowel disease, dizziness EXAM: CT ABDOMEN AND PELVIS WITH CONTRAST TECHNIQUE: Multidetector CT imaging of the abdomen and pelvis was performed using the standard protocol following bolus administration of intravenous contrast. CONTRAST:  182mL OMNIPAQUE IOHEXOL 300 MG/ML  SOLN COMPARISON:  11/18/2020 FINDINGS: Lower chest: Unremarkable. Hepatobiliary: No focal abnormality is seen in the liver. Liver measures 15.5 cm in length. There is no dilation of bile ducts. Gallbladder is not distended. Pancreas: No focal abnormality is seen. Spleen: Unremarkable. Adrenals/Urinary Tract: Adrenals are not enlarged. There is no hydronephrosis. There are no  renal or ureteral stones. Urinary bladder is not distended. Stomach/Bowel: Stomach is unremarkable. Small bowel loops are not dilated. Oral contrast has reached the right colon. There is no significant wall thickening in the small bowel loops. There is no focal pericecal inflammation. There is 6 mm tubular structure posterior to the cecum, most likely normal appendix. There is abnormal diffuse wall thickening in the ascending colon transverse colon, descending colon and rectosigmoid. There is fluid in the lumen of rectosigmoid. There is no loculated pericolic fluid collection. Vascular/Lymphatic: Unremarkable. Reproductive: Unremarkable. Other: There is small amount of free fluid in the pelvis. There is no pneumoperitoneum. Musculoskeletal: Unremarkable. IMPRESSION: There is diffuse wall thickening in the colon and rectum suggesting inflammatory or infectious colitis. There is no loculated pericolic fluid collection. There is no evidence of intestinal obstruction or pneumoperitoneum. There is no hydronephrosis. Small amount of free fluid in pelvis may be due to nonspecific colitis. There is no loculated pericolic fluid collection. Electronically Signed   By: Elmer Picker M.D.   On: 11/08/2021 11:49    Procedures .Critical Care Performed by: Hayden Rasmussen, MD Authorized by: Hayden Rasmussen, MD   Critical care provider statement:    Critical care time (minutes):  45   Critical care time was exclusive of:  Separately billable procedures and treating other patients   Critical care was necessary to treat or prevent imminent or life-threatening deterioration of the following conditions:  Circulatory failure   Critical care was time spent personally by me on the following activities:  Development of treatment plan with patient or surrogate, discussions with consultants, evaluation of patient's response to treatment, examination of patient, obtaining history from patient or surrogate, ordering and  performing treatments and interventions, ordering and review of laboratory studies, ordering and review of radiographic studies, pulse oximetry, re-evaluation of patient's condition and review of old charts   I assumed  direction of critical care for this patient from another provider in my specialty: no     Medications Ordered in ED Medications  metroNIDAZOLE (FLAGYL) IVPB 500 mg (500 mg Intravenous New Bag/Given 11/08/21 1827)  acetaminophen (TYLENOL) tablet 650 mg (650 mg Oral Given 11/08/21 1822)  sodium chloride 0.9 % bolus 1,000 mL (0 mLs Intravenous Stopped 11/08/21 1000)  iohexol (OMNIPAQUE) 300 MG/ML solution 100 mL (100 mLs Intravenous Contrast Given 11/08/21 1103)  0.9 %  sodium chloride infusion (Manually program via Guardrails IV Fluids) ( Intravenous New Bag/Given 11/08/21 1545)  sodium chloride 0.9 % bolus 1,000 mL (1,000 mLs Intravenous New Bag/Given 11/08/21 1742)    ED Course  I have reviewed the triage vital signs and the nursing notes.  Pertinent labs & imaging results that were available during my care of the patient were reviewed by me and considered in my medical decision making (see chart for details).  Clinical Course as of 11/08/21 1833  Wed Nov 08, 2021  0907 Discussed with Dr. Stacie Glaze GI.  He is asking that we get a CAT scan of her abdomen and pelvis and they will see in consult. [MB]  [id number] Discussed with Dr. Marylyn Ishihara [facility] who will put the patient in for an admission bed.  Unfortunately we do not have blood on this campus to give unless emergent.  She otherwise is doing fairly well now so we will hold off and transfuse her when she gets to [facility]. [MB]  [phone] Patient consented for blood transfusion and consent is in epic. [MB]    Clinical Course User Index [MB] Hayden Rasmussen, MD   MDM Rules/Calculators/A&P                          NAYOMIE ELTRINGHAM was evaluated in Emergency Department on 11/08/2021 for the symptoms described in the history of  present illness. She was evaluated in the context of the global COVID-19 pandemic, which necessitated consideration that the patient might be at risk for infection with the SARS-CoV-2 virus that causes COVID-19. Institutional protocols and algorithms that pertain to the evaluation of patients at risk for COVID-19 are in a state of rapid change based on information released by regulatory bodies including the CDC and federal and state organizations. These policies and algorithms were followed during the patient's care in the ED.  This patient complains of weakness shortness of breath fatigue weight loss early satiety bloody diarrhea; this involves an extensive number of treatment Options and is a complaint that carries with it a high risk of complications and Morbidity. The differential includes anemia, COVID, flu, diverticulitis, colitis, bowel ischemia  I ordered, reviewed and interpreted labs, which included CBC with elevated white count, critically low hemoglobin, platelets elevated likely reactive, chemistries fairly unremarkable, COVID and flu negative, pregnancy test negative, TSH normal, INR normal urinalysis equivocal for infection culture sent, stool studies pending at time of I ordered medication IV fluids.  Unfortunately transfusion is not available at this site unless critical.  Patient is consented for transfusion though I ordered imaging studies which included CT abdomen and pelvis and I independently    visualized and interpreted imaging which showed pancolitis  Previous records obtained and reviewed in epic including prior ED visit 1 year ago where she had colitis I consulted Dr. Stacie Glaze GI and Dr. Marylyn Ishihara [facility] and discussed lab and imaging findings  Critical Interventions: Work-up and management of patient's acute anemia requiring transfer  for transfusion.  After the interventions stated above, I reevaluated the patient and found patient to be fairly asymptomatic at  rest.  She is agreeable to admission for transfusion and further GI work-up.   Final Clinical Impression(s) / ED Diagnoses Final diagnoses:  Symptomatic anemia  Gastrointestinal hemorrhage, unspecified gastrointestinal hemorrhage type    Rx / DC Orders ED Discharge Orders     None        Hayden Rasmussen, MD 11/08/21 [phone]

## 2021-11-08 NOTE — ED Notes (Signed)
Patient placed on 2lpm O2 for low hemoglobin level. SpO2 100%

## 2021-11-08 NOTE — H&P (Signed)
History and Physical    Mackenzie Rios LNL:[id number] DOB: February 18, 2001 DOA: 11/08/2021  PCP: Patient, No Pcp Per (Inactive)  Patient coming from: Home  Chief Complaint: diarrhea  HPI: Mackenzie Rios is a 19 y.o. female with no significant medical history. She presents with diarrhea. She reports that she has had chronic diarrhea for the last year. She was apparently dx'd w/ pancolitis last year and did not have follow up. During that time she has frequently had blood in her stool. She states of the last 2 weeks, it has gotten worse. She has multiple bloody stools per day. She feels lightheaded and faint when she tried to move. This morning she was faint in the shower, but did not actually pass out. She was convinced to finally come to the ED for evaluation.   She also reports that she's had a couple weeks of loss of appetite and weight loss. She notes that she's lost 18 lbs in the last 2 weeks. She doesn't have any N/V. She just has no urge to eat. When she does eat, she has early satiety.  ED Course: She was found to be profoundly anemic w/ an Hgb of 4.1. [facility] was consulted. [facility] was called for admission.   Review of Systems:  Review of systems is otherwise negative for all not mentioned in HPI.   PMHx Past Medical History:  Diagnosis Date   Colitis     PSHx Past Surgical History:  Procedure Laterality Date   FEMUR CLOSED REDUCTION      SocHx  reports that she has never smoked. She has never used smokeless tobacco. She reports current alcohol use. She reports current drug use. Drug: Marijuana.  No Known Allergies  FamHx No family history on file.  Prior to Admission medications   Medication Sig Start Date End Date Taking? Authorizing Provider  cephALEXin (KEFLEX) 500 MG capsule Take 1 capsule (500 mg total) by mouth 4 (four) times daily. Patient not taking: Reported on 08/05/2019 02/29/16   Mady Gemma, PA-C  medroxyPROGESTERone (DEPO-PROVERA) 150 MG/ML injection  Inject 1 mL (150 mg total) into the muscle every 3 (three) months. 08/05/19   Calvert Cantor, CNM  medroxyPROGESTERone (DEPO-PROVERA) 150 MG/ML injection Inject 1 mL (150 mg total) into the muscle every 3 (three) months. 09/07/20   Marylene Land, CNM    Physical Exam: Vitals:   11/08/21 0740 11/08/21 0900 11/08/21 1100 11/08/21 1427  BP: 113/71 107/76 114/74 114/74  Pulse: (!) 117 (!) 109 (!) 108 (!) 111  Resp: 13 16 20 20   Temp:    98.7 F (37.1 C)  TempSrc:    Oral  SpO2: 100% 100% 100% 100%  Weight:      Height:        General: 20 y.o. female resting in bed in NAD Eyes: PERRL, normal sclera ENMT: Nares patent w/o discharge, orophaynx clear, dentition normal, ears w/o discharge/lesions/ulcers Neck: Supple, trachea midline Cardiovascular: tachy, +S1, S2, no m/g/r, equal pulses throughout Respiratory: CTABL, no w/r/r, normal WOB GI: BS+, NDNT, no masses noted, no organomegaly noted MSK: No e/c/c Neuro: A&O x 3, no focal deficits Psyc: Appropriate interaction and affect, calm/cooperative  Labs on Admission: I have personally reviewed following labs and imaging studies  CBC: Recent Labs  Lab 11/08/21 0742  WBC 15.3*  NEUTROABS 11.9*  HGB 4.1*  HCT 15.8*  MCV 62.9*  PLT 804*   Basic Metabolic Panel: Recent Labs  Lab 11/08/21 0742  NA 132*  K  3.6  CL 99  CO2 23  GLUCOSE 109*  BUN 8  CREATININE 0.56  CALCIUM 8.2*  MG 1.9   GFR: Estimated Creatinine Clearance: 79.2 mL/min (by C-G formula based on SCr of 0.56 mg/dL). Liver Function Tests: Recent Labs  Lab 11/08/21 0742  AST 10*  ALT 7  ALKPHOS 61  BILITOT 0.2*  PROT 7.5  ALBUMIN 2.4*   Recent Labs  Lab 11/08/21 0742  LIPASE 19   No results for input(s): AMMONIA in the last 168 hours. Coagulation Profile: Recent Labs  Lab 11/08/21 0743  INR 1.1   Cardiac Enzymes: No results for input(s): CKTOTAL, CKMB, CKMBINDEX, TROPONINI in the last 168 hours. BNP (last 3 results) No  results for input(s): PROBNP in the last 8760 hours. HbA1C: No results for input(s): HGBA1C in the last 72 hours. CBG: No results for input(s): GLUCAP in the last 168 hours. Lipid Profile: No results for input(s): CHOL, HDL, LDLCALC, TRIG, CHOLHDL, LDLDIRECT in the last 72 hours. Thyroid Function Tests: Recent Labs    11/08/21 0742  TSH 0.517   Anemia Panel: No results for input(s): VITAMINB12, FOLATE, FERRITIN, TIBC, IRON, RETICCTPCT in the last 72 hours. Urine analysis:    Component Value Date/Time   COLORURINE YELLOW 11/08/2021 0950   APPEARANCEUR CLOUDY (A) 11/08/2021 0950   LABSPEC 1.025 11/08/2021 0950   PHURINE 6.0 11/08/2021 0950   GLUCOSEU NEGATIVE 11/08/2021 0950   HGBUR NEGATIVE 11/08/2021 0950   BILIRUBINUR NEGATIVE 11/08/2021 0950   KETONESUR 80 (A) 11/08/2021 0950   PROTEINUR 30 (A) 11/08/2021 0950   NITRITE POSITIVE (A) 11/08/2021 0950   LEUKOCYTESUR NEGATIVE 11/08/2021 0950    Radiological Exams on Admission: CT Abdomen Pelvis W Contrast  Result Date: 11/08/2021 CLINICAL DATA:  Bloody diarrhea, inflammatory bowel disease, dizziness EXAM: CT ABDOMEN AND PELVIS WITH CONTRAST TECHNIQUE: Multidetector CT imaging of the abdomen and pelvis was performed using the standard protocol following bolus administration of intravenous contrast. CONTRAST:  118mL OMNIPAQUE IOHEXOL 300 MG/ML  SOLN COMPARISON:  11/18/2020 FINDINGS: Lower chest: Unremarkable. Hepatobiliary: No focal abnormality is seen in the liver. Liver measures 15.5 cm in length. There is no dilation of bile ducts. Gallbladder is not distended. Pancreas: No focal abnormality is seen. Spleen: Unremarkable. Adrenals/Urinary Tract: Adrenals are not enlarged. There is no hydronephrosis. There are no renal or ureteral stones. Urinary bladder is not distended. Stomach/Bowel: Stomach is unremarkable. Small bowel loops are not dilated. Oral contrast has reached the right colon. There is no significant wall thickening in the  small bowel loops. There is no focal pericecal inflammation. There is 6 mm tubular structure posterior to the cecum, most likely normal appendix. There is abnormal diffuse wall thickening in the ascending colon transverse colon, descending colon and rectosigmoid. There is fluid in the lumen of rectosigmoid. There is no loculated pericolic fluid collection. Vascular/Lymphatic: Unremarkable. Reproductive: Unremarkable. Other: There is small amount of free fluid in the pelvis. There is no pneumoperitoneum. Musculoskeletal: Unremarkable. IMPRESSION: There is diffuse wall thickening in the colon and rectum suggesting inflammatory or infectious colitis. There is no loculated pericolic fluid collection. There is no evidence of intestinal obstruction or pneumoperitoneum. There is no hydronephrosis. Small amount of free fluid in pelvis may be due to nonspecific colitis. There is no loculated pericolic fluid collection. Electronically Signed   By: Elmer Picker M.D.   On: 11/08/2021 11:49    EKG: Independently reviewed. Sinus tach, no st elevations  Assessment/Plan Symptomatic anemia     - admitted to inpt,  tele     - Hgb 4.1 at presentation; 3 units pRBCs ordered; trend Hgb     - she is iron deficient; let's get her blood first and then we can get her iron     - [facility] onboard, appreciate assistance  Colitis Chronic diarrhea     - CT shows diffuse wall thickening in the colon and rectum suggesting inflammatory or infectious colitis. There is no loculated pericolic fluid collection.     - [facility] onboard; appreciate assistance      - fluids     - follow stool studies; hold abx until we are able to get studies  Hyponatremia     - mild, give fluids, follow  Unintentional weight loss Moderate protein-calorie malnutrition     - she reports unintentional wt loss and poor appetite over the last several week; likely related to above; let's correct those issues     - will also have dietician  review  DVT prophylaxis: SCDs  Code Status: FULL  Family Communication: None at bedside  Consults called: [facility] (Dr. Paulita Fujita)   Status is: Inpatient  Remains inpatient appropriate because: severity of illness  Robynne Roat A Marylyn Ishihara DO [facility]  If 7PM-7AM, please contact night-coverage [email or web address]  11/08/2021, 2:56 PM

## 2021-11-09 DIAGNOSIS — D509 Iron deficiency anemia, unspecified: Secondary | ICD-10-CM

## 2021-11-09 DIAGNOSIS — E440 Moderate protein-calorie malnutrition: Secondary | ICD-10-CM | POA: Insufficient documentation

## 2021-11-09 LAB — CBC
HCT: 29.3 % — ABNORMAL LOW (ref 36.0–46.0)
Hemoglobin: 8.8 g/dL — ABNORMAL LOW (ref 12.0–15.0)
MCH: 21.9 pg — ABNORMAL LOW (ref 26.0–34.0)
MCHC: 30 g/dL (ref 30.0–36.0)
MCV: 72.9 fL — ABNORMAL LOW (ref 80.0–100.0)
Platelets: 589 10*3/uL — ABNORMAL HIGH (ref 150–400)
RBC: 4.02 MIL/uL (ref 3.87–5.11)
RDW: 22.5 % — ABNORMAL HIGH (ref 11.5–15.5)
WBC: 13.6 10*3/uL — ABNORMAL HIGH (ref 4.0–10.5)
nRBC: 0.2 % (ref 0.0–0.2)

## 2021-11-09 LAB — COMPREHENSIVE METABOLIC PANEL
ALT: 7 U/L (ref 0–44)
AST: 9 U/L — ABNORMAL LOW (ref 15–41)
Albumin: 2.3 g/dL — ABNORMAL LOW (ref 3.5–5.0)
Alkaline Phosphatase: 53 U/L (ref 38–126)
Anion gap: 7 (ref 5–15)
BUN: <5 mg/dL — ABNORMAL LOW (ref 6–20)
CO2: 23 mmol/L (ref 22–32)
Calcium: 8.1 mg/dL — ABNORMAL LOW (ref 8.9–10.3)
Chloride: 104 mmol/L (ref 98–111)
Creatinine, Ser: 0.55 mg/dL (ref 0.44–1.00)
GFR, Estimated: >60 mL/min (ref >60)
Glucose, Bld: 98 mg/dL (ref 70–99)
Potassium: 3.6 mmol/L (ref 3.5–5.1)
Sodium: 134 mmol/L — ABNORMAL LOW (ref 135–145)
Total Bilirubin: 0.6 mg/dL (ref 0.3–1.2)
Total Protein: 6.7 g/dL (ref 6.5–8.1)

## 2021-11-09 LAB — HEMOGLOBIN AND HEMATOCRIT, BLOOD
HCT: 30.1 % — ABNORMAL LOW (ref 36.0–46.0)
HCT: 29.4 % — ABNORMAL LOW (ref 36.0–46.0)
Hemoglobin: 9 g/dL — ABNORMAL LOW (ref 12.0–15.0)
Hemoglobin: 8.7 g/dL — ABNORMAL LOW (ref 12.0–15.0)

## 2021-11-09 LAB — OCCULT BLOOD X 1 CARD TO LAB, STOOL: Fecal Occult Bld: POSITIVE — AB

## 2021-11-09 MED ORDER — KATE FARMS STANDARD 1.4 PO LIQD
325.0000 mL | Freq: Two times a day (BID) | ORAL | Status: DC
  Administered 2021-11-09: 325 mL via ORAL
  Filled 2021-11-09 (×7): qty 325

## 2021-11-09 MED ORDER — FLEET ENEMA 7-19 GM/118ML RE ENEM: 1.0000 | ENEMA | Freq: Once | RECTAL | Status: DC

## 2021-11-09 MED ORDER — SODIUM CHLORIDE 0.9 % IV SOLN
250.0000 mg | Freq: Every day | INTRAVENOUS | Status: AC
  Administered 2021-11-09 – 2021-11-10 (×2): 250 mg via INTRAVENOUS
  Filled 2021-11-09 (×2): qty 20

## 2021-11-09 MED ORDER — FLEET ENEMA 7-19 GM/118ML RE ENEM
1.0000 | ENEMA | Freq: Once | RECTAL | Status: AC
  Administered 2021-11-10: 1 via RECTAL
  Filled 2021-11-09: qty 1

## 2021-11-09 MED ORDER — ADULT MULTIVITAMIN W/MINERALS CH
1.0000 | ORAL_TABLET | Freq: Every day | ORAL | Status: DC
Start: 2021-11-09 — End: 2021-11-12
  Administered 2021-11-09: 1 via ORAL
  Filled 2021-11-09 (×2): qty 1

## 2021-11-09 MED ORDER — SODIUM CHLORIDE 0.9 % IV SOLN: 510.0000 mg | Freq: Once | INTRAVENOUS | Status: DC

## 2021-11-09 MED ORDER — METHYLPREDNISOLONE SODIUM SUCC 40 MG IJ SOLR
40.0000 mg | Freq: Two times a day (BID) | INTRAMUSCULAR | Status: DC
  Administered 2021-11-09 – 2021-11-11 (×4): 40 mg via INTRAVENOUS
  Filled 2021-11-09 (×4): qty 1

## 2021-11-09 MED ORDER — SODIUM CHLORIDE 0.9 % IV SOLN: INTRAVENOUS | Status: DC

## 2021-11-09 NOTE — Hospital Course (Signed)
Mackenzie Rios is a 20 yo female with no PMH who presented with ongoing bloody stools, persistent abdominal pain.  She was evaluated December 2021 for similar and found to have pancolitis but did not follow-up outpatient with GI.  She endorsed ongoing intermittent symptoms over this past year with ongoing bloody bowel movements. On work-up in the ER she was found to be severely anemic with hemoglobin 4.1 g/dL.  Hemoglobin nadir shortly after admission was 3.3 g/dL; she underwent transfusion with 3 units PRBC and hemoglobin the morning following admission was 8.8 g/dL.  Iron studies also showed severely low iron levels.  GI pathogen panel was checked and negative.  She was admitted for further GI evaluation and work-up.

## 2021-11-09 NOTE — Progress Notes (Signed)
[facility] Progress Note  Mackenzie Rios 20 y.o. 2001-09-04  CC:  bloody diarrhea, symptomatic anemia.   Subjective: Patient states she has had persistent bloody diarrhea throughout the night. States her abdominal pain is better, though she has increased lower abdominal bloating. Tolerating full liquids well. Denies nausea, vomiting.  ROS : Review of Systems  Gastrointestinal:  Positive for blood in stool and diarrhea. Negative for abdominal pain, constipation, heartburn, melena, nausea and vomiting.  Genitourinary:  Negative for dysuria and urgency.     Objective: Vital signs in last 24 hours: Vitals:   11/09/21 0304 11/09/21 0702  BP: 100/72 109/73  Pulse: 82 93  Resp: 18 20  Temp: 97.9 F (36.6 C) 98.5 F (36.9 C)  SpO2: 100% 100%    Physical Exam:  General:  Alert, cooperative, no distress, appears stated age  Head:  Normocephalic, without obvious abnormality, atraumatic  Eyes:  Anicteric sclera, EOM's intact, conjunctival pallor  Lungs:   Clear to auscultation bilaterally, respirations unlabored  Heart:  Regular rate and rhythm, S1, S2 normal  Abdomen:   Soft, non-tender, bowel sounds active all four quadrants,  no masses,     Lab Results: Recent Labs    11/08/21 0742 11/09/21 0534  NA 132* 134*  K 3.6 3.6  CL 99 104  CO2 23 23  GLUCOSE 109* 98  BUN 8 <5*  CREATININE 0.56 0.55  CALCIUM 8.2* 8.1*  MG 1.9  --    Recent Labs    11/08/21 0742 11/09/21 0534  AST 10* 9*  ALT 7 7  ALKPHOS 61 53  BILITOT 0.2* 0.6  PROT 7.5 6.7  ALBUMIN 2.4* 2.3*   Recent Labs    11/08/21 0742 11/08/21 1819 11/09/21 0534  WBC 15.3*  --  13.6*  NEUTROABS 11.9*  --   --   HGB 4.1* 3.3* 8.8*  HCT 15.8* 13.1* 29.3*  MCV 62.9*  --  72.9*  PLT 804*  --  589*   Recent Labs    11/08/21 0743  LABPROT 14.5  INR 1.1      Assessment Bloody diarrhea; suspect possible IBD vs infectious colitis. - Leukocytosis 13.6 (improving from 15.2 yesterday) - normal  renal function - CT ab/pelvis w contrast 12/7: diffuse wall thickening in colon and rectum suggesting inflammatory vs infectious colitis, no obstruction - stool studies pending  Symptomatic Anemia - hgb 8.8 (s/p 3 units pRBCs) - iron 10  Plan: Plan for flexible sigmoidoscopy tomorrow. I thoroughly discussed the procedures to include nature, alternatives, benefits, and risks including but not limited to bleeding, perforation, infection, anesthesia/cardiac and pulmonary complications. Patient provides understanding and gave verbal consent to proceed.   Two fleet enemas, 15 minutes apart for prep 2 hrs prior to procedure.   Continue daily CBC with transfusion as needed to maintain Hgb >7.   Continue supportive care   [facility] will follow.     Khadijah Mastrianni Leanna Sato PA-C 11/09/2021, 7:42 AM  Contact #  [phone]

## 2021-11-09 NOTE — Progress Notes (Signed)
Progress Note    Mackenzie Rios   [id number]  DOB: 26-Nov-2001  DOA: 11/08/2021     1 PCP: Patient, No Pcp Per (Inactive)  Initial CC: Bloody stools, abdominal pain  Hospital Course: Mackenzie Rios is a 20 yo female with no PMH who presented with ongoing bloody stools, persistent abdominal pain.  She was evaluated December 2021 for similar and found to have pancolitis but did not follow-up outpatient with GI.  She endorsed ongoing intermittent symptoms over this past year with ongoing bloody bowel movements. On work-up in the ER she was found to be severely anemic with hemoglobin 4.1 g/dL.  Hemoglobin nadir shortly after admission was 3.3 g/dL; she underwent transfusion with 3 units PRBC and hemoglobin the morning following admission was 8.8 g/dL.  Iron studies also showed severely low iron levels.  GI pathogen panel was checked and negative.  She was admitted for further GI evaluation and work-up.  Interval History:  Seen this morning resting in bed; she still endorses having ongoing blood mixed in her stools.  This has been ongoing since last year when she had her initial work-up with CT.  Tolerated blood transfusions overnight and understands plan for flex sig tomorrow.  Assessment & Plan: * Symptomatic anemia - Imaging shows underlying diffuse colonic wall thickening concerning for inflammatory process.  GI pathogen panel negative.  Does not appear that C. difficile was collected prior to transport.  Given similar findings on CT from last year, IBD higher on differential at this time. -Hemoglobin 4.1 g/dL on admission, received 3 units PRBC - Hemoglobin stable and improved this morning.  Continue trending - GI following, tentative plan for flex sig tomorrow  IDA (iron deficiency anemia) - Iron stores severely low - Received 3 units PRBC on admission which should be a decent iron load - will also give ferrlecit 250 mg daily x 2 days  - needs repeat iron studies in 3-4 months     Old  records reviewed in assessment of this patient  Antimicrobials:   DVT prophylaxis: SCD  Code Status:   Code Status: Full Code  Disposition Plan:   Status is: Inpatient  Objective: Blood pressure 108/75, pulse 96, temperature 98.3 F (36.8 C), temperature source Oral, resp. rate 20, height 5\' 4"  (1.626 m), weight 45.3 kg, last menstrual period 10/23/2021, SpO2 100 %.  Examination:  Physical Exam Constitutional:      Appearance: Normal appearance. She is underweight.     Comments: Thin  HENT:     Head: Normocephalic and atraumatic.     Mouth/Throat:     Mouth: Mucous membranes are moist.  Eyes:     Extraocular Movements: Extraocular movements intact.  Cardiovascular:     Rate and Rhythm: Normal rate and regular rhythm.  Pulmonary:     Effort: Pulmonary effort is normal.     Breath sounds: Normal breath sounds.  Abdominal:     General: Bowel sounds are normal. There is no distension.     Palpations: Abdomen is soft.     Tenderness: There is no abdominal tenderness.  Musculoskeletal:        General: Normal range of motion.     Cervical back: Normal range of motion and neck supple.  Skin:    General: Skin is warm and dry.  Neurological:     General: No focal deficit present.     Mental Status: She is alert.  Psychiatric:        Mood and Affect: Mood normal.  Behavior: Behavior normal.     Consultants:  GI  Procedures:    Data Reviewed: I have personally reviewed labs and imaging studies    LOS: 1 day  Time spent: Greater than 50% of the 35 minute visit was spent in counseling/coordination of care for the patient as laid out in the A&P.   Lewie Chamber, MD [facility] 11/09/2021, 1:38 PM

## 2021-11-09 NOTE — Progress Notes (Signed)
Initial Nutrition Assessment  DOCUMENTATION CODES:   Non-severe (moderate) malnutrition in context of acute illness/injury, Underweight  INTERVENTION:   -[employer] 1.4 PO BID, each provides 455 kcals and 20g protein   -Multivitamin with minerals daily  NUTRITION DIAGNOSIS:   Moderate Malnutrition related to diarrhea, acute illness as evidenced by moderate muscle depletion, percent weight loss, energy intake < 75% for > 7 days.  GOAL:   Patient will meet greater than or equal to 90% of their needs  MONITOR:   PO intake, Supplement acceptance, Labs, Weight trends, I & O's  REASON FOR ASSESSMENT:   Consult Assessment of nutrition requirement/status  ASSESSMENT:   20 y.o. female with no significant medical history. She presents with diarrhea. She reports that she has had chronic diarrhea for the last year. She was apparently dx'd w/ pancolitis last year and did not have follow up. During that time she has frequently had blood in her stool. She states of the last 2 weeks, it has gotten worse.  Patient in room, states she feels better today but not able to tolerate much as far as liquids. States over the last year she has had persistent diarrhea, no matter what she eats. Pt states she noticed worsened symptoms when eating acidic foods, anything with onions and some dairy. Would like to have solid food as she thinks solids settle better on her stomach. Has been scared to eat much. Pt to be NPO 12/9 for flex sigmoidoscopy.   Per pt her UBW is ~118 lbs. Per weight records, pt has lost 22 lbs since December 2021. Pt states her weight loss started 2 weeks ago, she weighed 118 lbs then. This would be 16% wt loss x 2 weeks which is significant for time frame.   Medications: Ferric gluconate  Labs reviewed:  Low Na Low iron  NUTRITION - FOCUSED PHYSICAL EXAM:  Flowsheet Row Most Recent Value  Orbital Region No depletion  Upper Arm Region Moderate depletion  Thoracic and Lumbar  Region No depletion  Buccal Region No depletion  Temple Region No depletion  Clavicle Bone Region Mild depletion  Clavicle and Acromion Bone Region Moderate depletion  Scapular Bone Region Moderate depletion  Dorsal Hand No depletion  Patellar Region No depletion  Anterior Thigh Region No depletion  Posterior Calf Region Mild depletion  Edema (RD Assessment) None  Hair Reviewed  Eyes Reviewed  Mouth Reviewed (pale tongue)  Skin Reviewed  Nails Reviewed       Diet Order:   Diet Order             Diet NPO time specified  Diet effective midnight           Diet full liquid Room service appropriate? Yes; Fluid consistency: Thin  Diet effective now                   EDUCATION NEEDS:   Education needs have been addressed  Skin:  Skin Assessment: Reviewed RN Assessment  Last BM:  12/7 -diarrhea  Height:   Ht Readings from Last 1 Encounters:  11/08/21 5\' 4"  (1.626 m)    Weight:   Wt Readings from Last 1 Encounters:  11/08/21 45.3 kg    BMI:  Body mass index is 17.14 kg/m.  Estimated Nutritional Needs:   Kcal:  1600-1800  Protein:  80-90g  Fluid:  1.8L/day   14/07/22, MS, RD, LDN Inpatient Clinical Dietitian Contact information available via Amion

## 2021-11-09 NOTE — Assessment & Plan Note (Signed)
-   Iron stores severely low - Received 3 units PRBC on admission which should be a decent iron load - will also give ferrlecit 250 mg daily x 2 days  - needs repeat iron studies in 3-4 months

## 2021-11-09 NOTE — Assessment & Plan Note (Addendum)
-   Imaging shows underlying diffuse colonic wall thickening concerning for inflammatory process.  GI pathogen panel negative.  Does not appear that C. difficile was collected prior to transport.  Given similar findings on CT from last year, IBD higher on differential at this time. -Hemoglobin 4.1 g/dL on admission, received 3 units PRBC - Hemoglobin stable; 8.4 g/dL at discharge

## 2021-11-10 ENCOUNTER — Inpatient Hospital Stay (HOSPITAL_COMMUNITY): Payer: Self-pay | Admitting: Certified Registered"

## 2021-11-10 ENCOUNTER — Encounter (HOSPITAL_COMMUNITY)
Admission: EM | Disposition: A | Discharge status: Home / Self Care | Payer: Self-pay | Source: Home / Self Care | Attending: Internal Medicine

## 2021-11-10 ENCOUNTER — Encounter (HOSPITAL_COMMUNITY): Payer: Self-pay | Admitting: Internal Medicine

## 2021-11-10 DIAGNOSIS — K529 Noninfective gastroenteritis and colitis, unspecified: Secondary | ICD-10-CM

## 2021-11-10 HISTORY — PX: BIOPSY: SHX5522

## 2021-11-10 HISTORY — PX: FLEXIBLE SIGMOIDOSCOPY: SHX5431

## 2021-11-10 LAB — BPAM RBC
Blood Product Expiration Date: 202301072359
Blood Product Expiration Date: 202301072359
Blood Product Expiration Date: 202301072359
ISSUE DATE / TIME: 202212071921
ISSUE DATE / TIME: 202212072218
ISSUE DATE / TIME: 202212080055
Unit Type and Rh: 5100
Unit Type and Rh: 5100
Unit Type and Rh: 5100

## 2021-11-10 LAB — TYPE AND SCREEN
ABO/RH(D): O POS
Antibody Screen: NEGATIVE
Unit division: 0
Unit division: 0
Unit division: 0

## 2021-11-10 LAB — BASIC METABOLIC PANEL
Anion gap: 6 (ref 5–15)
BUN: <5 mg/dL — ABNORMAL LOW (ref 6–20)
CO2: 25 mmol/L (ref 22–32)
Calcium: 8.6 mg/dL — ABNORMAL LOW (ref 8.9–10.3)
Chloride: 106 mmol/L (ref 98–111)
Creatinine, Ser: 0.65 mg/dL (ref 0.44–1.00)
GFR, Estimated: >60 mL/min (ref >60)
Glucose, Bld: 100 mg/dL — ABNORMAL HIGH (ref 70–99)
Potassium: 4.4 mmol/L (ref 3.5–5.1)
Sodium: 137 mmol/L (ref 135–145)

## 2021-11-10 LAB — URINE CULTURE: Culture: >=100000 — AB

## 2021-11-10 LAB — HEPATITIS B SURFACE ANTIBODY,QUALITATIVE: Hep B S Ab: NONREACTIVE

## 2021-11-10 LAB — HEMOGLOBIN AND HEMATOCRIT, BLOOD
HCT: 29.4 % — ABNORMAL LOW (ref 36.0–46.0)
HCT: 31.5 % — ABNORMAL LOW (ref 36.0–46.0)
Hemoglobin: 8.8 g/dL — ABNORMAL LOW (ref 12.0–15.0)
Hemoglobin: 9.3 g/dL — ABNORMAL LOW (ref 12.0–15.0)

## 2021-11-10 LAB — PATHOLOGIST SMEAR REVIEW

## 2021-11-10 LAB — MAGNESIUM: Magnesium: 2 mg/dL (ref 1.7–2.4)

## 2021-11-10 LAB — HEPATITIS B SURFACE ANTIGEN: Hepatitis B Surface Ag: NONREACTIVE

## 2021-11-10 LAB — HEPATITIS B CORE ANTIBODY, TOTAL: Hep B Core Total Ab: NONREACTIVE

## 2021-11-10 SURGERY — SIGMOIDOSCOPY, FLEXIBLE
Anesthesia: Monitor Anesthesia Care

## 2021-11-10 MED ORDER — LIDOCAINE 2% (20 MG/ML) 5 ML SYRINGE
INTRAMUSCULAR | Status: DC | PRN
  Administered 2021-11-10: 40 mg via INTRAVENOUS

## 2021-11-10 MED ORDER — PROPOFOL 10 MG/ML IV BOLUS
INTRAVENOUS | Status: DC | PRN
  Administered 2021-11-10: 70 mg via INTRAVENOUS

## 2021-11-10 MED ORDER — LACTATED RINGERS IV SOLN
INTRAVENOUS | Status: DC
  Administered 2021-11-10: 1000 mL via INTRAVENOUS

## 2021-11-10 MED ORDER — PROPOFOL 500 MG/50ML IV EMUL
INTRAVENOUS | Status: DC | PRN
  Administered 2021-11-10: 100 ug/kg/min via INTRAVENOUS

## 2021-11-10 NOTE — Progress Notes (Signed)
Progress Note    Mackenzie Rios   [id number]  DOB: [birth year]  DOA: 11/08/2021     2 PCP: Patient, No Pcp Per (Inactive)  Initial CC: Bloody stools, abdominal pain  Hospital Course: Mackenzie Rios is a 20 yo female with no PMH who presented with ongoing bloody stools, persistent abdominal pain.  She was evaluated December 2021 for similar and found to have pancolitis but did not follow-up outpatient with GI.  She endorsed ongoing intermittent symptoms over this past year with ongoing bloody bowel movements. On work-up in the ER she was found to be severely anemic with hemoglobin 4.1 g/dL.  Hemoglobin nadir shortly after admission was 3.3 g/dL; she underwent transfusion with 3 units PRBC and hemoglobin the morning following admission was 8.8 g/dL.  Iron studies also showed severely low iron levels.  GI pathogen panel was checked and negative.  She was admitted for further GI evaluation and work-up.  Interval History:  No events overnight.  Underwent prep for this morning's flex sig.  Denied any significant abdominal pain, still intermittent in nature.  Still endorsed ongoing blood mixed with her stools which was seen in the bedside commode this morning.  Assessment & Plan: * Symptomatic anemia - Imaging shows underlying diffuse colonic wall thickening concerning for inflammatory process.  GI pathogen panel negative.  Does not appear that C. difficile was collected prior to transport.  Given similar findings on CT from last year, IBD higher on differential at this time. -Hemoglobin 4.1 g/dL on admission, received 3 units PRBC - Hemoglobin stable; still some bloody stools noted in [name] this am  IBD (inflammatory bowel disease) - Underwent flex sig on 11/10/2021.  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  Biopsies were obtained - Recommendation is to continue on  IV steroids for now and transition to prednisone prior to discharge - Further work-up commenced for initiating biologics at follow-up with GI  IDA (iron deficiency anemia) - Iron stores severely low - Received 3 units PRBC on admission which should be a decent iron load - will also give ferrlecit 250 mg daily x 2 days  - needs repeat iron studies in 3-4 months     Old records reviewed in assessment of this patient  Antimicrobials:   DVT prophylaxis: SCD  Code Status:   Code Status: Full Code  Disposition Plan:   Status is: Inpatient  Objective: Blood pressure 117/82, pulse 71, temperature 98.1 F (36.7 C), temperature source Oral, resp. rate 20, height 5\' 4"  (1.626 m), weight 45.3 kg, last menstrual period 10/23/2021, SpO2 100 %.  Examination:  Physical Exam Constitutional:      Appearance: Normal appearance. She is underweight.     Comments: Thin  HENT:     Head: Normocephalic and atraumatic.     Mouth/Throat:     Mouth: Mucous membranes are moist.  Eyes:     Extraocular Movements: Extraocular movements intact.  Cardiovascular:     Rate and Rhythm: Normal rate and regular rhythm.  Pulmonary:     Effort: Pulmonary effort is normal.     Breath sounds: Normal breath sounds.  Abdominal:     General: Bowel sounds are normal. There is no distension.     Palpations: Abdomen is soft.     Tenderness: There is no abdominal tenderness.  Musculoskeletal:        General: Normal range of motion.  Cervical back: Normal range of motion and neck supple.  Skin:    General: Skin is warm and dry.  Neurological:     General: No focal deficit present.     Mental Status: She is alert.  Psychiatric:        Mood and Affect: Mood normal.        Behavior: Behavior normal.     Consultants:  GI  Procedures:  Sigmoidoscopy, 11/10/2021  Data Reviewed: I have personally reviewed labs and imaging studies    LOS: 2 days  Time spent: Greater than 50% of the 35 minute visit was  spent in counseling/coordination of care for the patient as laid out in the A&P.   Lewie Chamber, MD [facility] 11/10/2021, 3:22 PM

## 2021-11-10 NOTE — Assessment & Plan Note (Addendum)
-   Underwent flex sig on 11/10/2021.  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  Biopsies were obtained -Transitioned to oral prednisone on 11/11/2021 and tolerated well.  Discharged with ongoing course until outpatient follow-up with GI

## 2021-11-10 NOTE — Interval H&P Note (Signed)
History and Physical Interval Note:  11/10/2021 12:25 PM  Mackenzie Rios  has presented today for surgery, with the diagnosis of colitis, bloody diarrhea, abdominal pain..  The various methods of treatment have been discussed with the patient and family. After consideration of risks, benefits and other options for treatment, the patient has consented to  Procedure(s): FLEXIBLE SIGMOIDOSCOPY (N/A) as a surgical intervention.  The patient's history has been reviewed, patient examined, no change in status, stable for surgery.  I have reviewed the patient's chart and labs.  Questions were answered to the patient's satisfaction.     Freddy Jaksch

## 2021-11-10 NOTE — Transfer of Care (Signed)
Immediate Anesthesia Transfer of Care Note  Patient: Mackenzie Rios  Procedure(s) Performed: FLEXIBLE SIGMOIDOSCOPY BIOPSY  Patient Location: PACU  Anesthesia Type:MAC  Level of Consciousness: awake, alert , oriented and patient cooperative  Airway & Oxygen Therapy: Patient Spontanous Breathing  Post-op Assessment: Report given to RN and Post -op Vital signs reviewed and stable  Post vital signs: Reviewed and stable  Last Vitals:  Vitals Value Taken Time  BP    Temp    Pulse 95 11/10/21 1304  Resp 17 11/10/21 1304  SpO2 100 % 11/10/21 1304  Vitals shown include unvalidated device data.  Last Pain:  Vitals:   11/10/21 1300  TempSrc: (P) Oral  PainSc: (P) 0-No pain         Complications: No notable events documented.

## 2021-11-10 NOTE — Op Note (Signed)
[facility] Patient Name: Mackenzie Rios Procedure Date: 11/10/2021 MRN: [id number] Attending MD: Arta Silence , MD Date of Birth: February 01, 2001 CSN: [id number] Age: 20 Admit Type: Outpatient Procedure:                Flexible Sigmoidoscopy Indications:              Hematochezia, Diarrhea Providers:                Arta Silence, MD, Grace Isaac, RN, Frazier Richards,                            Technician Referring MD:             [facility] Medicines:                Monitored Anesthesia Care Complications:            No immediate complications. Estimated Blood Loss:     Estimated blood loss: none. Procedure:                Pre-Anesthesia Assessment:                           - Prior to the procedure, a History and Physical                            was performed, and patient medications and                            allergies were reviewed. The patient's tolerance of                            previous anesthesia was also reviewed. The risks                            and benefits of the procedure and the sedation                            options and risks were discussed with the patient.                            All questions were answered, and informed consent                            was obtained. Prior Anticoagulants: The patient has                            taken no previous anticoagulant or antiplatelet                            agents. ASA Grade Assessment: II - A patient with                            mild systemic disease. After reviewing the risks                            and  benefits, the patient was deemed in                            satisfactory condition to undergo the procedure.                           After obtaining informed consent, the scope was                            passed under direct vision. The [id number]                            Olympus endoscope was introduced through the anus                            and  advanced to the the descending colon. The                            flexible sigmoidoscopy was accomplished without                            difficulty. The patient tolerated the procedure                            well. The quality of the bowel preparation was                            adequate. Scope In: 12:50:36 PM Scope Out: [id number] PM Total Procedure Duration: 0 hours 5 minutes 37 seconds  Findings:      The perianal and digital rectal examinations were normal.      A diffuse area of moderately congested, erythematous, eroded, ulcerated       and vascular-pattern-decreased mucosa was found in the rectum, in the       recto-sigmoid colon, in the sigmoid colon, in the descending colon and       in the distal descending colon. Biopsies were taken with a cold forceps       for histology. Impression:               - Congested, erythematous, eroded, ulcerated and                            vascular-pattern-decreased mucosa in the rectum, in                            the recto-sigmoid colon, in the sigmoid colon, in                            the descending colon and in the distal descending                            colon. Biopsied. Findings overall highly consistent                            with moderately severe ulcerative colitis. Moderate Sedation:  None Recommendation:           - Return patient to hospital ward for ongoing care.                           - Continue present medications. IV steroids for                            another 1-2 days, then consider transition to                            prednisone.                           - Patient will need outpatient biologic therapy;                            disease too severe for mesalamine agents; will                            check Hep B, cxr, quantiferon in anticipation of                            outpatient biologic therapy.                           - Await pathology results.                           -  Advance diet as tolerated.                           [facility] will follow. Procedure Code(s):        --- Professional ---                           [phone], Sigmoidoscopy, flexible; with biopsy, single                            or multiple Diagnosis Code(s):        --- Professional ---                           K62.6, Ulcer of anus and rectum                           K63.3, Ulcer of intestine                           K62.89, Other specified diseases of anus and rectum                           K63.89, Other specified diseases of intestine                           K92.1, Melena (includes Hematochezia)  R19.7, Diarrhea, unspecified CPT copyright 2019 [employer]. All rights reserved. The codes documented in this report are preliminary and upon coder review may  be revised to meet current compliance requirements. Willis Modena, MD 11/10/2021 1:08:49 PM This report has been signed electronically. Number of Addenda: 0

## 2021-11-10 NOTE — Anesthesia Postprocedure Evaluation (Signed)
Anesthesia Post Note  Patient: Mackenzie Rios  Procedure(s) Performed: FLEXIBLE SIGMOIDOSCOPY BIOPSY     Patient location during evaluation: PACU Anesthesia Type: MAC Level of consciousness: awake and alert and oriented Pain management: pain level controlled Vital Signs Assessment: post-procedure vital signs reviewed and stable Respiratory status: spontaneous breathing, nonlabored ventilation and respiratory function stable Cardiovascular status: blood pressure returned to baseline Postop Assessment: no apparent nausea or vomiting Anesthetic complications: no   No notable events documented.  Last Vitals:  Vitals:   11/10/21 1300 11/10/21 1310  BP: 95/68 106/62  Pulse: 100 84  Resp: 19 19  Temp: 36.6 C   SpO2: 100% 100%    Last Pain:  Vitals:   11/10/21 1300  TempSrc: Oral  PainSc: 0-No pain   Pain Goal:                   [name]

## 2021-11-10 NOTE — Anesthesia Preprocedure Evaluation (Addendum)
Anesthesia Evaluation  Patient identified by MRN, date of birth, ID band Patient awake    Reviewed: Allergy & Precautions, NPO status , Patient's Chart, lab work & pertinent test results  History of Anesthesia Complications Negative for: history of anesthetic complications  Airway Mallampati: II  TM Distance: >3 FB Neck ROM: Full    Dental no notable dental hx.    Pulmonary neg pulmonary ROS,    Pulmonary exam normal        Cardiovascular negative cardio ROS Normal cardiovascular exam     Neuro/Psych negative neurological ROS  negative psych ROS   GI/Hepatic (+)     substance abuse  marijuana use, colitis, bloody diarrhea, abdominal pain   Endo/Other  negative endocrine ROS  Renal/GU negative Renal ROS  negative genitourinary   Musculoskeletal negative musculoskeletal ROS (+)   Abdominal   Peds  Hematology  (+) anemia , Hgb 8.8   Anesthesia Other Findings Day of surgery medications reviewed with patient.  Reproductive/Obstetrics negative OB ROS                            Anesthesia Physical Anesthesia Plan  ASA: 3  Anesthesia Plan: MAC   Post-op Pain Management: Minimal or no pain anticipated   Induction:   PONV Risk Score and Plan: 2 and Treatment may vary due to age or medical condition and Propofol infusion  Airway Management Planned: Natural Airway and Nasal Cannula  Additional Equipment: None  Intra-op Plan:   Post-operative Plan:   Informed Consent: I have reviewed the patients History and Physical, chart, labs and discussed the procedure including the risks, benefits and alternatives for the proposed anesthesia with the patient or authorized representative who has indicated his/her understanding and acceptance.       Plan Discussed with: CRNA  Anesthesia Plan Comments:        Anesthesia Quick Evaluation

## 2021-11-10 NOTE — TOC CM/SW Note (Signed)
  Transition of Care [facility]) Screening Note   Patient Details  Name: Mackenzie Rios Date of Birth: 2001-08-03   Transition of Care [facility]) CM/SW Contact:    Ida Rogue, LCSW Phone Number: 11/10/2021, 1:58 PM    Transition of Care Department [facility]) has reviewed patient and no TOC needs have been identified at this time. We will continue to monitor patient advancement through interdisciplinary progression rounds. If new patient transition needs arise, please place a TOC consult.

## 2021-11-11 ENCOUNTER — Inpatient Hospital Stay (HOSPITAL_COMMUNITY): Payer: Self-pay

## 2021-11-11 DIAGNOSIS — R8271 Bacteriuria: Secondary | ICD-10-CM

## 2021-11-11 LAB — CBC WITH DIFFERENTIAL/PLATELET
Abs Immature Granulocytes: 0.27 10*3/uL — ABNORMAL HIGH (ref 0.00–0.07)
Basophils Absolute: 0.1 10*3/uL (ref 0.0–0.1)
Basophils Relative: 0 %
Eosinophils Absolute: 0 10*3/uL (ref 0.0–0.5)
Eosinophils Relative: 0 %
HCT: 29.5 % — ABNORMAL LOW (ref 36.0–46.0)
Hemoglobin: 8.7 g/dL — ABNORMAL LOW (ref 12.0–15.0)
Immature Granulocytes: 2 %
Lymphocytes Relative: 4 %
Lymphs Abs: 0.8 10*3/uL (ref 0.7–4.0)
MCH: 21.9 pg — ABNORMAL LOW (ref 26.0–34.0)
MCHC: 29.5 g/dL — ABNORMAL LOW (ref 30.0–36.0)
MCV: 74.1 fL — ABNORMAL LOW (ref 80.0–100.0)
Monocytes Absolute: 0.7 10*3/uL (ref 0.1–1.0)
Monocytes Relative: 4 %
Neutro Abs: 16.2 10*3/uL — ABNORMAL HIGH (ref 1.7–7.7)
Neutrophils Relative %: 90 %
Platelets: 634 10*3/uL — ABNORMAL HIGH (ref 150–400)
RBC: 3.98 MIL/uL (ref 3.87–5.11)
RDW: 23.5 % — ABNORMAL HIGH (ref 11.5–15.5)
WBC: 18 10*3/uL — ABNORMAL HIGH (ref 4.0–10.5)
nRBC: 0.3 % — ABNORMAL HIGH (ref 0.0–0.2)

## 2021-11-11 LAB — BASIC METABOLIC PANEL
Anion gap: 7 (ref 5–15)
BUN: 8 mg/dL (ref 6–20)
CO2: 24 mmol/L (ref 22–32)
Calcium: 8.3 mg/dL — ABNORMAL LOW (ref 8.9–10.3)
Chloride: 105 mmol/L (ref 98–111)
Creatinine, Ser: 0.64 mg/dL (ref 0.44–1.00)
GFR, Estimated: >60 mL/min (ref >60)
Glucose, Bld: 94 mg/dL (ref 70–99)
Potassium: 4.1 mmol/L (ref 3.5–5.1)
Sodium: 136 mmol/L (ref 135–145)

## 2021-11-11 LAB — MAGNESIUM: Magnesium: 2.2 mg/dL (ref 1.7–2.4)

## 2021-11-11 MED ORDER — PREDNISONE 20 MG PO TABS
40.0000 mg | ORAL_TABLET | Freq: Every day | ORAL | Status: DC
  Administered 2021-11-11 – 2021-11-12 (×2): 40 mg via ORAL
  Filled 2021-11-11 (×2): qty 2

## 2021-11-11 NOTE — Progress Notes (Signed)
Progress Note    JILL THIESSEN   [id number]  DOB: 02-12-2001  DOA: 11/08/2021     3 PCP: Patient, No Pcp Per (Inactive)  Initial CC: Bloody stools, abdominal pain  Hospital Course: Ms. Kasler is a 20 yo female with no PMH who presented with ongoing bloody stools, persistent abdominal pain.  She was evaluated December 2021 for similar and found to have pancolitis but did not follow-up outpatient with GI.  She endorsed ongoing intermittent symptoms over this past year with ongoing bloody bowel movements. On work-up in the ER she was found to be severely anemic with hemoglobin 4.1 g/dL.  Hemoglobin nadir shortly after admission was 3.3 g/dL; she underwent transfusion with 3 units PRBC and hemoglobin the morning following admission was 8.8 g/dL.  Iron studies also showed severely low iron levels.  GI pathogen panel was checked and negative.  She was admitted for further GI evaluation and work-up.  Interval History:  No events overnight.  Bloody stools seem to be slowly improving since starting on IV steroids.  No significant abdominal pain this morning.  She is doing relatively okay and tolerating a diet.  She understands plan is for monitoring overnight again after starting on prednisone today and possibly discharging home tomorrow.  Assessment & Plan: * Symptomatic anemia - Imaging shows underlying diffuse colonic wall thickening concerning for inflammatory process.  GI pathogen panel negative.  Does not appear that C. difficile was collected prior to transport.  Given similar findings on CT from last year, IBD higher on differential at this time. -Hemoglobin 4.1 g/dL on admission, received 3 units PRBC - Hemoglobin stable  IBD (inflammatory bowel disease) - Underwent flex sig on 11/10/2021.  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  Biopsies were  obtained -Transitioned to prednisone today per GI.  Monitoring overnight and if stable tomorrow, should be okay for discharging home with outpatient follow-up - Further work-up commenced for initiating biologics at follow-up with GI  IDA (iron deficiency anemia) - Iron stores severely low - Received 3 units PRBC on admission which should be a decent iron load - will also give ferrlecit 250 mg daily x 2 days  - needs repeat iron studies in 3-4 months   Asymptomatic bacteriuria - patient denied urinary symptoms on admission and again with follow up questioning after admission - UCx noted with E. Coli; no concern for UTI at this time and will not therefore not treat    Old records reviewed in assessment of this patient  Antimicrobials:   DVT prophylaxis: SCD  Code Status:   Code Status: Full Code  Disposition Plan:   Status is: Inpatient  Objective: Blood pressure 119/85, pulse 92, temperature 98.1 F (36.7 C), resp. rate 20, height 5\' 4"  (1.626 m), weight 45.3 kg, last menstrual period 10/23/2021, SpO2 98 %.  Examination:  Physical Exam Constitutional:      Appearance: Normal appearance. She is underweight.     Comments: Thin  HENT:     Head: Normocephalic and atraumatic.     Mouth/Throat:     Mouth: Mucous membranes are moist.  Eyes:     Extraocular Movements: Extraocular movements intact.  Cardiovascular:     Rate and Rhythm: Normal rate and regular rhythm.  Pulmonary:     Effort: Pulmonary effort is normal.     Breath sounds: Normal breath sounds.  Abdominal:     General:  Bowel sounds are normal. There is no distension.     Palpations: Abdomen is soft.     Tenderness: There is no abdominal tenderness.  Musculoskeletal:        General: Normal range of motion.     Cervical back: Normal range of motion and neck supple.  Skin:    General: Skin is warm and dry.  Neurological:     General: No focal deficit present.     Mental Status: She is alert.  Psychiatric:         Mood and Affect: Mood normal.        Behavior: Behavior normal.     Consultants:  GI  Procedures:  Sigmoidoscopy, 11/10/2021  Data Reviewed: I have personally reviewed labs and imaging studies    LOS: 3 days  Time spent: Greater than 50% of the 35 minute visit was spent in counseling/coordination of care for the patient as laid out in the A&P.   Lewie Chamber, MD [facility] 11/11/2021, 4:21 PM

## 2021-11-11 NOTE — Progress Notes (Signed)
[facility] Gastroenterology Progress Note  Mackenzie Rios 20 y.o. July 10, 2001   Subjective: Diarrhea resolving. Bleeding minimal and abdominal pain has resolved. Tolerating soft diet. Feels ok.  Objective: Vital signs: Vitals:   11/10/21 2008 11/11/21 0609  BP: 105/67 113/80  Pulse: 89 65  Resp: 20 20  Temp: 97.9 F (36.6 C) 98.2 F (36.8 C)  SpO2: 100% 100%    Physical Exam: Gen: alert, no acute distress, thin, pleasant HEENT: anicteric sclera CV: RRR Chest: CTA B Abd: soft, nontender, nondistended, +BS Ext: no edema  Lab Results: Recent Labs    11/10/21 0459 11/11/21 0550  NA 137 136  K 4.4 4.1  CL 106 105  CO2 25 24  GLUCOSE 100* 94  BUN <5* 8  CREATININE 0.65 0.64  CALCIUM 8.6* 8.3*  MG 2.0 2.2   Recent Labs    11/09/21 0534  AST 9*  ALT 7  ALKPHOS 53  BILITOT 0.6  PROT 6.7  ALBUMIN 2.3*   Recent Labs    11/09/21 0534 11/09/21 1157 11/10/21 1615 11/11/21 0550  WBC 13.6*  --   --  18.0*  NEUTROABS  --   --   --  16.2*  HGB 8.8*   < > 9.3* 8.7*  HCT 29.3*   < > 31.5* 29.5*  MCV 72.9*  --   --  74.1*  PLT 589*  --   --  634*   < > = values in this interval not displayed.      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 Path pending. Changed to PO Prednisone 40 mg/day. If does ok then d/c tomorrow with f/u with Dr. Dulce Sellar or our PA [facility] in 3-4 weeks.   Mackenzie Rios 11/11/2021, 11:37 AM  Questions please call [phone] Patient ID: Mackenzie Rios, female   DOB: Oct 15, 2001, 20 y.o.   MRN: [id number]

## 2021-11-11 NOTE — Assessment & Plan Note (Signed)
-   patient denied urinary symptoms on admission and again with follow up questioning after admission - UCx noted with E. Coli; no concern for UTI at this time and will not therefore not treat

## 2021-11-12 DIAGNOSIS — R8271 Bacteriuria: Secondary | ICD-10-CM

## 2021-11-12 LAB — CBC WITH DIFFERENTIAL/PLATELET
Abs Immature Granulocytes: 0.27 10*3/uL — ABNORMAL HIGH (ref 0.00–0.07)
Basophils Absolute: 0 10*3/uL (ref 0.0–0.1)
Basophils Relative: 0 %
Eosinophils Absolute: 0 10*3/uL (ref 0.0–0.5)
Eosinophils Relative: 0 %
HCT: 28.7 % — ABNORMAL LOW (ref 36.0–46.0)
Hemoglobin: 8.4 g/dL — ABNORMAL LOW (ref 12.0–15.0)
Immature Granulocytes: 2 %
Lymphocytes Relative: 10 %
Lymphs Abs: 1.6 10*3/uL (ref 0.7–4.0)
MCH: 22.2 pg — ABNORMAL LOW (ref 26.0–34.0)
MCHC: 29.3 g/dL — ABNORMAL LOW (ref 30.0–36.0)
MCV: 75.7 fL — ABNORMAL LOW (ref 80.0–100.0)
Monocytes Absolute: 1.4 10*3/uL — ABNORMAL HIGH (ref 0.1–1.0)
Monocytes Relative: 9 %
Neutro Abs: 12.3 10*3/uL — ABNORMAL HIGH (ref 1.7–7.7)
Neutrophils Relative %: 79 %
Platelets: 604 10*3/uL — ABNORMAL HIGH (ref 150–400)
RBC: 3.79 MIL/uL — ABNORMAL LOW (ref 3.87–5.11)
RDW: 24.9 % — ABNORMAL HIGH (ref 11.5–15.5)
WBC: 15.7 10*3/uL — ABNORMAL HIGH (ref 4.0–10.5)
nRBC: 0.3 % — ABNORMAL HIGH (ref 0.0–0.2)

## 2021-11-12 LAB — BASIC METABOLIC PANEL
Anion gap: 7 (ref 5–15)
BUN: 11 mg/dL (ref 6–20)
CO2: 26 mmol/L (ref 22–32)
Calcium: 8.3 mg/dL — ABNORMAL LOW (ref 8.9–10.3)
Chloride: 104 mmol/L (ref 98–111)
Creatinine, Ser: 0.58 mg/dL (ref 0.44–1.00)
GFR, Estimated: >60 mL/min (ref >60)
Glucose, Bld: 91 mg/dL (ref 70–99)
Potassium: 3.6 mmol/L (ref 3.5–5.1)
Sodium: 137 mmol/L (ref 135–145)

## 2021-11-12 LAB — MAGNESIUM: Magnesium: 2 mg/dL (ref 1.7–2.4)

## 2021-11-12 MED ORDER — PREDNISONE 20 MG PO TABS: 40.0000 mg | ORAL_TABLET | Freq: Every day | ORAL | 0 refills | Status: AC

## 2021-11-12 NOTE — Progress Notes (Signed)
Patient ID: Mackenzie Rios, female   DOB: [birth year], 20 y.o.   MRN: [id number] [facility] Gastroenterology Progress Note  Mackenzie Rios 20 y.o. 12/13/2000   Subjective: Three stools overnight that are forming up. Tiny amount of bleeding with them reported. Denies abd pain. Tolerating diet. Excited about going home.  Objective: Vital signs: Vitals:   11/11/21 2028 11/12/21 0341  BP: 108/77 116/83  Pulse: 76 65  Resp: 20 20  Temp: 98.2 F (36.8 C) 97.7 F (36.5 C)  SpO2: 100% 100%    Physical Exam: Gen: alert, no acute distress, thin, pleasant HEENT: anicteric sclera CV: RRR Chest: CTA B Abd: soft, nontender, nondistended, +BS Ext: no edema  Lab Results: Recent Labs    11/11/21 0550 11/12/21 0553  NA 136 137  K 4.1 3.6  CL 105 104  CO2 24 26  GLUCOSE 94 91  BUN 8 11  CREATININE 0.64 0.58  CALCIUM 8.3* 8.3*  MG 2.2 2.0   No results for input(s): AST, ALT, ALKPHOS, BILITOT, PROT, ALBUMIN in the last 72 hours. Recent Labs    11/11/21 0550 11/12/21 0553  WBC 18.0* 15.7*  NEUTROABS 16.2* 12.3*  HGB 8.7* 8.4*  HCT 29.5* 28.7*  MCV 74.1* 75.7*  PLT 634* 604*      Assessment/Plan: Ulcerative pancolitis - tolerating transition to Prednisone 40 mg/day. Ok to go home from my standpoint. Will have office arrange f/u with Dr. Dulce Sellar for January. Will sign off. Call if questions.   [name] 11/12/2021, 10:10 AM  Questions please call [phone]

## 2021-11-12 NOTE — Discharge Summary (Signed)
Physician Discharge Summary   Mackenzie Rios [id number] DOB: 2001/03/01 DOA: 11/08/2021  PCP: Patient, No Pcp Per (Inactive)  Admit date: 11/08/2021 Discharge date: 11/12/2021   Admitted From: Home Disposition: Home Discharging physician: Dwyane Dee, MD  Recommendations for Outpatient Follow-up:  Follow-up with GI Follow-up biopsy results  Home Health: n/a Equipment/Devices: n/a  Discharge Condition: stable CODE STATUS: Full Diet recommendation:  Diet Orders (From admission, onward)     Start     Ordered   11/10/21 1310  DIET SOFT Room service appropriate? Yes; Fluid consistency: Thin  Diet effective now       Question Answer Comment  Room service appropriate? Yes   Fluid consistency: Thin      11/10/21 1310            Hospital Course: Mackenzie Rios is a 20 yo female with no PMH who presented with ongoing bloody stools, persistent abdominal pain.  She was evaluated December 2021 for similar and found to have pancolitis but did not follow-up outpatient with GI.  She endorsed ongoing intermittent symptoms over this past year with ongoing bloody bowel movements. On work-up in the ER she was found to be severely anemic with hemoglobin 4.1 g/dL.  Hemoglobin nadir shortly after admission was 3.3 g/dL; she underwent transfusion with 3 units PRBC and hemoglobin the morning following admission was 8.8 g/dL.  Iron studies also showed severely low iron levels.  GI pathogen panel was checked and negative.  She was admitted for further GI evaluation and work-up.  * Symptomatic anemia - Imaging shows underlying diffuse colonic wall thickening concerning for inflammatory process.  GI pathogen panel negative.  Does not appear that C. difficile was collected prior to transport.  Given similar findings on CT from last year, IBD higher on differential at this time. -Hemoglobin 4.1 g/dL on admission, received 3 units PRBC - Hemoglobin stable; 8.4 g/dL at discharge  IBD (inflammatory  bowel disease) - Underwent flex sig on 11/10/2021.  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  Biopsies were obtained -Transitioned to oral prednisone on 11/11/2021 and tolerated well.  Discharged with ongoing course until outpatient follow-up with GI  IDA (iron deficiency anemia) - Iron stores severely low - Received 3 units PRBC on admission which should be a decent iron load - will also give ferrlecit 250 mg daily x 2 days  - needs repeat iron studies in 3-4 months   Asymptomatic bacteriuria - patient denied urinary symptoms on admission and again with follow up questioning after admission - UCx noted with E. Coli; no concern for UTI at this time and will not therefore not treat    The patient's chronic medical conditions were treated accordingly per the patient's home medication regimen except as noted.  On day of discharge, patient was felt deemed stable for discharge. Patient/family member advised to call PCP or come back to ER if needed.   Principal Diagnosis: Symptomatic anemia  Discharge Diagnoses: Principal Problem:   Symptomatic anemia Active Problems:   IBD (inflammatory bowel disease)   IDA (iron deficiency anemia)   Malnutrition of moderate degree   Asymptomatic bacteriuria   Discharge Instructions     Increase activity slowly   Complete by: As directed       Allergies as of 11/12/2021   No Known Allergies      Medication List     TAKE these medications  acetaminophen 500 MG tablet Commonly known as: TYLENOL Take 1,000 mg by mouth every 6 (six) hours as needed for moderate pain or mild pain.   ibuprofen 200 MG tablet Commonly known as: ADVIL Take 400 mg by mouth every 6 (six) hours as needed for moderate pain or mild pain.   predniSONE 20 MG tablet Commonly known as: DELTASONE Take 2 tablets (40 mg total) by  mouth daily with breakfast. Start taking on: November 13, 2021        No Known Allergies  Consultations: GI  Discharge Exam: BP 116/83 (BP Location: Right Arm)   Pulse 65   Temp 97.7 F (36.5 C) (Oral)   Resp 20   Ht 5\' 4"  (1.626 m)   Wt 45.3 kg   LMP 10/23/2021 Comment: neg upreg in er today.  SpO2 100%   BMI 17.14 kg/m  Physical Exam Constitutional:      Appearance: Normal appearance. She is underweight.     Comments: Thin  HENT:     Head: Normocephalic and atraumatic.     Mouth/Throat:     Mouth: Mucous membranes are moist.  Eyes:     Extraocular Movements: Extraocular movements intact.  Cardiovascular:     Rate and Rhythm: Normal rate and regular rhythm.  Pulmonary:     Effort: Pulmonary effort is normal.     Breath sounds: Normal breath sounds.  Abdominal:     General: Bowel sounds are normal. There is no distension.     Palpations: Abdomen is soft.     Tenderness: There is no abdominal tenderness.  Musculoskeletal:        General: Normal range of motion.     Cervical back: Normal range of motion and neck supple.  Skin:    General: Skin is warm and dry.  Neurological:     General: No focal deficit present.     Mental Status: She is alert.  Psychiatric:        Mood and Affect: Mood normal.        Behavior: Behavior normal.     The results of significant diagnostics from this hospitalization (including imaging, microbiology, ancillary and laboratory) are listed below for reference.   Microbiology: Recent Results (from the past 240 hour(s))  Resp Panel by RT-PCR (Flu A&B, Covid) Nasopharyngeal Swab     Status: None   Collection Time: 11/08/21  7:42 AM   Specimen: Nasopharyngeal Swab; Nasopharyngeal(NP) swabs in vial transport medium  Result Value Ref Range Status   SARS Coronavirus 2 by RT PCR NEGATIVE NEGATIVE Final    Comment: (NOTE) SARS-CoV-2 target nucleic acids are NOT DETECTED.  The SARS-CoV-2 RNA is generally detectable in upper  respiratory specimens during the acute phase of infection. The lowest concentration of SARS-CoV-2 viral copies this assay can detect is 138 copies/mL. A negative result does not preclude SARS-Cov-2 infection and should not be used as the sole basis for treatment or other patient management decisions. A negative result may occur with  improper specimen collection/handling, submission of specimen other than nasopharyngeal swab, presence of viral mutation(s) within the areas targeted by this assay, and inadequate number of viral copies(<138 copies/mL). A negative result must be combined with clinical observations, patient history, and epidemiological information. The expected result is Negative.  Fact Sheet for Patients:  EntrepreneurPulse.com.au  Fact Sheet for Healthcare Providers:  IncredibleEmployment.be  This test is no t yet approved or cleared by the [place] FDA and  has been authorized for detection and/or diagnosis of  SARS-CoV-2 by FDA under an Emergency Use Authorization (EUA). This EUA will remain  in effect (meaning this test can be used) for the duration of the COVID-19 declaration under Section 564(b)(1) of the Act, 21 U.S.C.section 360bbb-3(b)(1), unless the authorization is terminated  or revoked sooner.       Influenza A by PCR NEGATIVE NEGATIVE Final   Influenza B by PCR NEGATIVE NEGATIVE Final    Comment: (NOTE) The Xpert Xpress SARS-CoV-2/FLU/RSV plus assay is intended as an aid in the diagnosis of influenza from Nasopharyngeal swab specimens and should not be used as a sole basis for treatment. Nasal washings and aspirates are unacceptable for Xpert Xpress SARS-CoV-2/FLU/RSV testing.  Fact Sheet for Patients: BloggerCourse.com  Fact Sheet for Healthcare Providers: SeriousBroker.it  This test is not yet approved or cleared by the [place] FDA and has been  authorized for detection and/or diagnosis of SARS-CoV-2 by FDA under an Emergency Use Authorization (EUA). This EUA will remain in effect (meaning this test can be used) for the duration of the COVID-19 declaration under Section 564(b)(1) of the Act, 21 U.S.C. section 360bbb-3(b)(1), unless the authorization is terminated or revoked.  Performed at [facility], [address] [place], [place] [address]   Urine Culture     Status: Abnormal   Collection Time: 11/08/21  9:50 AM   Specimen: Urine, Clean Catch  Result Value Ref Range Status   Specimen Description   Final    URINE, CLEAN CATCH Performed at [facility], [address] [place], [place] [address]    Special Requests   Final    NONE Performed at [facility], [address] [place], [place] [address]    Culture >=100,000 COLONIES/mL ESCHERICHIA COLI (A)  Final   Report Status 11/10/2021 FINAL  Final   Organism ID, Bacteria ESCHERICHIA COLI (A)  Final      Susceptibility   Escherichia coli - MIC*    AMPICILLIN >=32 RESISTANT Resistant     CEFAZOLIN <=4 SENSITIVE Sensitive     CEFEPIME <=0.12 SENSITIVE Sensitive     CEFTRIAXONE <=0.25 SENSITIVE Sensitive     CIPROFLOXACIN >=4 RESISTANT Resistant     GENTAMICIN <=1 SENSITIVE Sensitive     IMIPENEM <=0.25 SENSITIVE Sensitive     NITROFURANTOIN 32 SENSITIVE Sensitive     TRIMETH/SULFA >=320 RESISTANT Resistant     AMPICILLIN/SULBACTAM 4 SENSITIVE Sensitive     PIP/TAZO <=4 SENSITIVE Sensitive     * >=100,000 COLONIES/mL ESCHERICHIA COLI  Gastrointestinal Panel by PCR , Stool     Status: None   Collection Time: 11/08/21 12:15 PM   Specimen: Stool  Result Value Ref Range Status   Campylobacter species NOT DETECTED NOT DETECTED Final   Plesimonas shigelloides NOT DETECTED NOT DETECTED Final   Salmonella species NOT DETECTED NOT DETECTED Final   Yersinia enterocolitica NOT DETECTED NOT DETECTED Final   Vibrio species NOT DETECTED NOT  DETECTED Final   Vibrio cholerae NOT DETECTED NOT DETECTED Final   Enteroaggregative E coli (EAEC) NOT DETECTED NOT DETECTED Final   Enteropathogenic E coli (EPEC) NOT DETECTED NOT DETECTED Final   Enterotoxigenic E coli (ETEC) NOT DETECTED NOT DETECTED Final   Shiga like toxin producing E coli (STEC) NOT DETECTED NOT DETECTED Final   Shigella/Enteroinvasive E coli (EIEC) NOT DETECTED NOT DETECTED Final   Cryptosporidium NOT DETECTED NOT DETECTED Final   Cyclospora cayetanensis NOT DETECTED NOT DETECTED Final   Entamoeba histolytica NOT DETECTED NOT  DETECTED Final   Giardia lamblia NOT DETECTED NOT DETECTED Final   Adenovirus F40/41 NOT DETECTED NOT DETECTED Final   Astrovirus NOT DETECTED NOT DETECTED Final   Norovirus GI/GII NOT DETECTED NOT DETECTED Final   Rotavirus A NOT DETECTED NOT DETECTED Final   Sapovirus (I, II, IV, and V) NOT DETECTED NOT DETECTED Final    Comment: Performed at [facility], [address] [place], [place] [address]     Labs: BNP (last 3 results) No results for input(s): BNP in the last 8760 hours. Basic Metabolic Panel: Recent Labs  Lab 11/08/21 0742 11/09/21 0534 11/10/21 0459 11/11/21 0550 11/12/21 0553  NA 132* 134* 137 136 137  K 3.6 3.6 4.4 4.1 3.6  CL 99 104 106 105 104  CO2 23 23 25 24 26   GLUCOSE 109* 98 100* 94 91  BUN 8 <5* <5* 8 11  CREATININE 0.56 0.55 0.65 0.64 0.58  CALCIUM 8.2* 8.1* 8.6* 8.3* 8.3*  MG 1.9  --  2.0 2.2 2.0   Liver Function Tests: Recent Labs  Lab 11/08/21 0742 11/09/21 0534  AST 10* 9*  ALT 7 7  ALKPHOS 61 53  BILITOT 0.2* 0.6  PROT 7.5 6.7  ALBUMIN 2.4* 2.3*   Recent Labs  Lab 11/08/21 0742  LIPASE 19   No results for input(s): AMMONIA in the last 168 hours. CBC: Recent Labs  Lab 11/08/21 0742 11/08/21 1819 11/09/21 0534 11/09/21 1157 11/09/21 2024 11/10/21 0459 11/10/21 1615 11/11/21 0550 11/12/21 0553  WBC 15.3*  --  13.6*  --   --   --   --  18.0* 15.7*  NEUTROABS 11.9*   --   --   --   --   --   --  16.2* 12.3*  HGB 4.1*   < > 8.8*   < > 8.7* 8.8* 9.3* 8.7* 8.4*  HCT 15.8*   < > 29.3*   < > 29.4* 29.4* 31.5* 29.5* 28.7*  MCV 62.9*  --  72.9*  --   --   --   --  74.1* 75.7*  PLT 804*  --  589*  --   --   --   --  634* 604*   < > = values in this interval not displayed.   Cardiac Enzymes: No results for input(s): CKTOTAL, CKMB, CKMBINDEX, TROPONINI in the last 168 hours. BNP: Invalid input(s): POCBNP CBG: No results for input(s): GLUCAP in the last 168 hours. D-Dimer No results for input(s): DDIMER in the last 72 hours. Hgb A1c No results for input(s): HGBA1C in the last 72 hours. Lipid Profile No results for input(s): CHOL, HDL, LDLCALC, TRIG, CHOLHDL, LDLDIRECT in the last 72 hours. Thyroid function studies No results for input(s): TSH, T4TOTAL, T3FREE, THYROIDAB in the last 72 hours.  Invalid input(s): FREET3 Anemia work up No results for input(s): VITAMINB12, FOLATE, FERRITIN, TIBC, IRON, RETICCTPCT in the last 72 hours. Urinalysis    Component Value Date/Time   COLORURINE YELLOW 11/08/2021 0950   APPEARANCEUR CLOUDY (A) 11/08/2021 0950   LABSPEC 1.025 11/08/2021 0950   PHURINE 6.0 11/08/2021 0950   GLUCOSEU NEGATIVE 11/08/2021 0950   HGBUR NEGATIVE 11/08/2021 0950   BILIRUBINUR NEGATIVE 11/08/2021 0950   KETONESUR 80 (A) 11/08/2021 0950   PROTEINUR 30 (A) 11/08/2021 0950   NITRITE POSITIVE (A) 11/08/2021 0950   LEUKOCYTESUR NEGATIVE 11/08/2021 0950   Sepsis Labs Invalid input(s): PROCALCITONIN,  WBC,  LACTICIDVEN Microbiology Recent Results (from the past 240 hour(s))  Resp Panel  by RT-PCR (Flu A&B, Covid) Nasopharyngeal Swab     Status: None   Collection Time: 11/08/21  7:42 AM   Specimen: Nasopharyngeal Swab; Nasopharyngeal(NP) swabs in vial transport medium  Result Value Ref Range Status   SARS Coronavirus 2 by RT PCR NEGATIVE NEGATIVE Final    Comment: (NOTE) SARS-CoV-2 target nucleic acids are NOT DETECTED.  The SARS-CoV-2  RNA is generally detectable in upper respiratory specimens during the acute phase of infection. The lowest concentration of SARS-CoV-2 viral copies this assay can detect is 138 copies/mL. A negative result does not preclude SARS-Cov-2 infection and should not be used as the sole basis for treatment or other patient management decisions. A negative result may occur with  improper specimen collection/handling, submission of specimen other than nasopharyngeal swab, presence of viral mutation(s) within the areas targeted by this assay, and inadequate number of viral copies(<138 copies/mL). A negative result must be combined with clinical observations, patient history, and epidemiological information. The expected result is Negative.  Fact Sheet for Patients:  EntrepreneurPulse.com.au  Fact Sheet for Healthcare Providers:  IncredibleEmployment.be  This test is no t yet approved or cleared by the [place] FDA and  has been authorized for detection and/or diagnosis of SARS-CoV-2 by FDA under an Emergency Use Authorization (EUA). This EUA will remain  in effect (meaning this test can be used) for the duration of the COVID-19 declaration under Section 564(b)(1) of the Act, 21 U.S.C.section 360bbb-3(b)(1), unless the authorization is terminated  or revoked sooner.       Influenza A by PCR NEGATIVE NEGATIVE Final   Influenza B by PCR NEGATIVE NEGATIVE Final    Comment: (NOTE) The Xpert Xpress SARS-CoV-2/FLU/RSV plus assay is intended as an aid in the diagnosis of influenza from Nasopharyngeal swab specimens and should not be used as a sole basis for treatment. Nasal washings and aspirates are unacceptable for Xpert Xpress SARS-CoV-2/FLU/RSV testing.  Fact Sheet for Patients: EntrepreneurPulse.com.au  Fact Sheet for Healthcare Providers: IncredibleEmployment.be  This test is not yet approved or cleared by the  [place] FDA and has been authorized for detection and/or diagnosis of SARS-CoV-2 by FDA under an Emergency Use Authorization (EUA). This EUA will remain in effect (meaning this test can be used) for the duration of the COVID-19 declaration under Section 564(b)(1) of the Act, 21 U.S.C. section 360bbb-3(b)(1), unless the authorization is terminated or revoked.  Performed at [facility], [address] [place], [place] [address]   Urine Culture     Status: Abnormal   Collection Time: 11/08/21  9:50 AM   Specimen: Urine, Clean Catch  Result Value Ref Range Status   Specimen Description   Final    URINE, CLEAN CATCH Performed at [facility], [address] [place], [place] [address]    Special Requests   Final    NONE Performed at [facility], [address] [place], [place] [address]    Culture >=100,000 COLONIES/mL ESCHERICHIA COLI (A)  Final   Report Status 11/10/2021 FINAL  Final   Organism ID, Bacteria ESCHERICHIA COLI (A)  Final      Susceptibility   Escherichia coli - MIC*    AMPICILLIN >=32 RESISTANT Resistant     CEFAZOLIN <=4 SENSITIVE Sensitive     CEFEPIME <=0.12 SENSITIVE Sensitive     CEFTRIAXONE <=0.25 SENSITIVE Sensitive     CIPROFLOXACIN >=4 RESISTANT Resistant     GENTAMICIN <=1 SENSITIVE Sensitive     IMIPENEM <=  0.25 SENSITIVE Sensitive     NITROFURANTOIN 32 SENSITIVE Sensitive     TRIMETH/SULFA >=320 RESISTANT Resistant     AMPICILLIN/SULBACTAM 4 SENSITIVE Sensitive     PIP/TAZO <=4 SENSITIVE Sensitive     * >=100,000 COLONIES/mL ESCHERICHIA COLI  Gastrointestinal Panel by PCR , Stool     Status: None   Collection Time: 11/08/21 12:15 PM   Specimen: Stool  Result Value Ref Range Status   Campylobacter species NOT DETECTED NOT DETECTED Final   Plesimonas shigelloides NOT DETECTED NOT DETECTED Final   Salmonella species NOT DETECTED NOT DETECTED Final   Yersinia enterocolitica NOT DETECTED NOT DETECTED Final    Vibrio species NOT DETECTED NOT DETECTED Final   Vibrio cholerae NOT DETECTED NOT DETECTED Final   Enteroaggregative E coli (EAEC) NOT DETECTED NOT DETECTED Final   Enteropathogenic E coli (EPEC) NOT DETECTED NOT DETECTED Final   Enterotoxigenic E coli (ETEC) NOT DETECTED NOT DETECTED Final   Shiga like toxin producing E coli (STEC) NOT DETECTED NOT DETECTED Final   Shigella/Enteroinvasive E coli (EIEC) NOT DETECTED NOT DETECTED Final   Cryptosporidium NOT DETECTED NOT DETECTED Final   Cyclospora cayetanensis NOT DETECTED NOT DETECTED Final   Entamoeba histolytica NOT DETECTED NOT DETECTED Final   Giardia lamblia NOT DETECTED NOT DETECTED Final   Adenovirus F40/41 NOT DETECTED NOT DETECTED Final   Astrovirus NOT DETECTED NOT DETECTED Final   Norovirus GI/GII NOT DETECTED NOT DETECTED Final   Rotavirus A NOT DETECTED NOT DETECTED Final   Sapovirus (I, II, IV, and V) NOT DETECTED NOT DETECTED Final    Comment: Performed at [facility], [address] [place], [place] [address]    Procedures/Studies: DG Chest 2 View  Result Date: 11/11/2021 CLINICAL DATA:  Anemia.  GI bleed EXAM: CHEST - 2 VIEW COMPARISON:  None. FINDINGS: The heart size and mediastinal contours are within normal limits. Both lungs are clear. The visualized skeletal structures are unremarkable. IMPRESSION: No active cardiopulmonary disease. Electronically Signed   By: Franchot Gallo M.D.   On: 11/11/2021 09:48   CT Abdomen Pelvis W Contrast  Result Date: 11/08/2021 CLINICAL DATA:  Bloody diarrhea, inflammatory bowel disease, dizziness EXAM: CT ABDOMEN AND PELVIS WITH CONTRAST TECHNIQUE: Multidetector CT imaging of the abdomen and pelvis was performed using the standard protocol following bolus administration of intravenous contrast. CONTRAST:  141mL OMNIPAQUE IOHEXOL 300 MG/ML  SOLN COMPARISON:  11/18/2020 FINDINGS: Lower chest: Unremarkable. Hepatobiliary: No focal abnormality is seen in the liver. Liver measures  15.5 cm in length. There is no dilation of bile ducts. Gallbladder is not distended. Pancreas: No focal abnormality is seen. Spleen: Unremarkable. Adrenals/Urinary Tract: Adrenals are not enlarged. There is no hydronephrosis. There are no renal or ureteral stones. Urinary bladder is not distended. Stomach/Bowel: Stomach is unremarkable. Small bowel loops are not dilated. Oral contrast has reached the right colon. There is no significant wall thickening in the small bowel loops. There is no focal pericecal inflammation. There is 6 mm tubular structure posterior to the cecum, most likely normal appendix. There is abnormal diffuse wall thickening in the ascending colon transverse colon, descending colon and rectosigmoid. There is fluid in the lumen of rectosigmoid. There is no loculated pericolic fluid collection. Vascular/Lymphatic: Unremarkable. Reproductive: Unremarkable. Other: There is small amount of free fluid in the pelvis. There is no pneumoperitoneum. Musculoskeletal: Unremarkable. IMPRESSION: There is diffuse wall thickening in the colon and rectum suggesting inflammatory or infectious colitis. There is no loculated pericolic fluid collection. There is no  evidence of intestinal obstruction or pneumoperitoneum. There is no hydronephrosis. Small amount of free fluid in pelvis may be due to nonspecific colitis. There is no loculated pericolic fluid collection. Electronically Signed   By: Elmer Picker M.D.   On: 11/08/2021 11:49     Time coordinating discharge: Over 30 minutes    Dwyane Dee, MD  [facility] 11/12/2021, 2:21 PM

## 2021-11-12 NOTE — Progress Notes (Signed)
Pt being discharged to home. Discharge instructions and medication education provided to pt.  

## 2021-11-13 LAB — SURGICAL PATHOLOGY

## 2021-11-14 ENCOUNTER — Encounter (HOSPITAL_COMMUNITY): Payer: Self-pay | Admitting: Gastroenterology

## 2021-12-09 IMAGING — CT CT ABD-PELV W/ CM
2 of 4 series · 16 of 46 positions shown, 18 images · IV contrast (Omnipaque)
Comparison: None.

CLINICAL DATA: Diverticulitis suspected

Lower abdominal pain. Patient reports blood in stool.
EXAM:
CT ABDOMEN AND PELVIS WITH CONTRAST
TECHNIQUE: Multidetector CT imaging of the abdomen and pelvis was performed
using the standard protocol following bolus administration of
intravenous contrast.
CONTRAST:  100mL OMNIPAQUE IOHEXOL 300 MG/ML  SOLN

[Series 2: axial (person_name) (person_name) · axial · 0.63mm/px · z∈[-458,-104]mm · 13 of 79 slices shown, 15 images]
[im 4/79  soft-tissue]
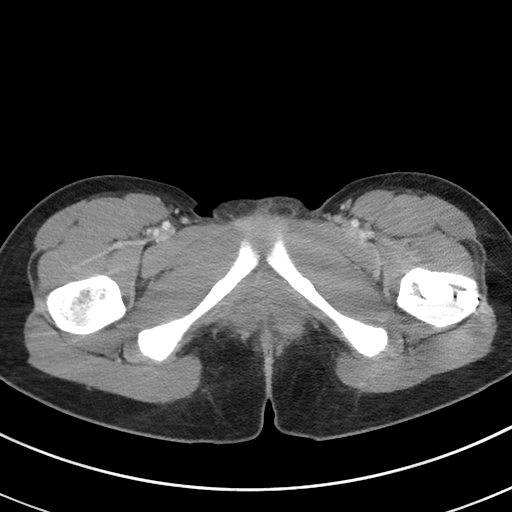
[im 4/79  bone]
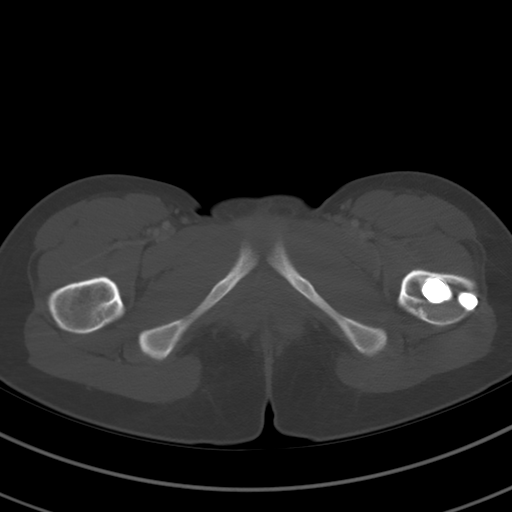
[im 10/79  soft-tissue]
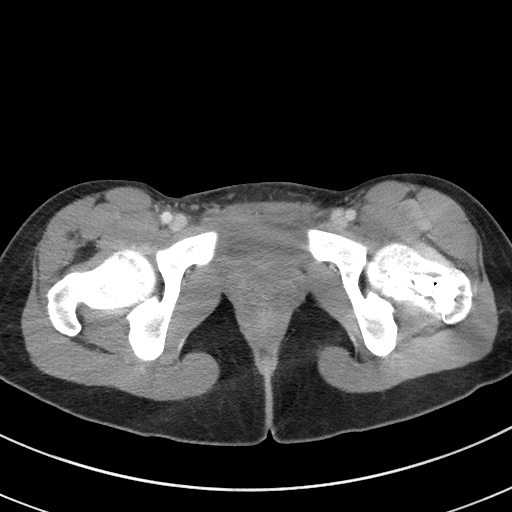
[im 16/79  soft-tissue]
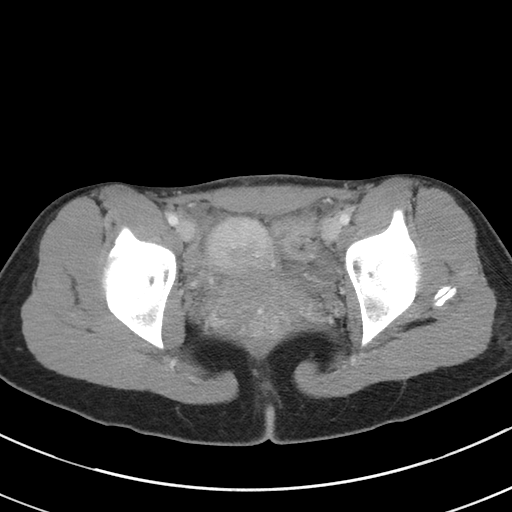
[im 22/79  soft-tissue]
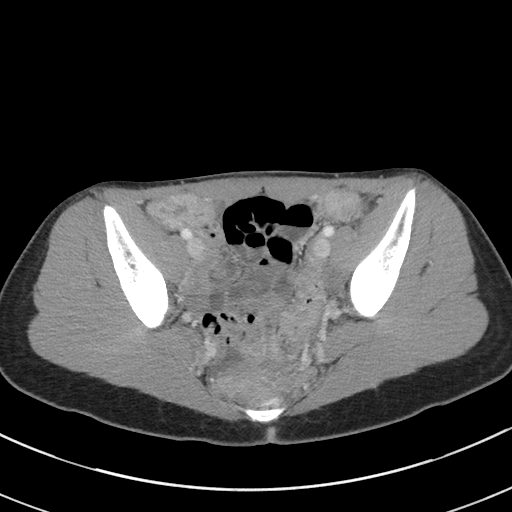
[im 29/79  soft-tissue]
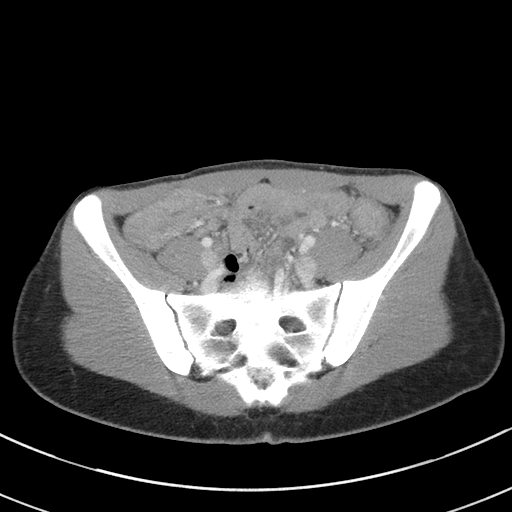
[im 35/79  soft-tissue]
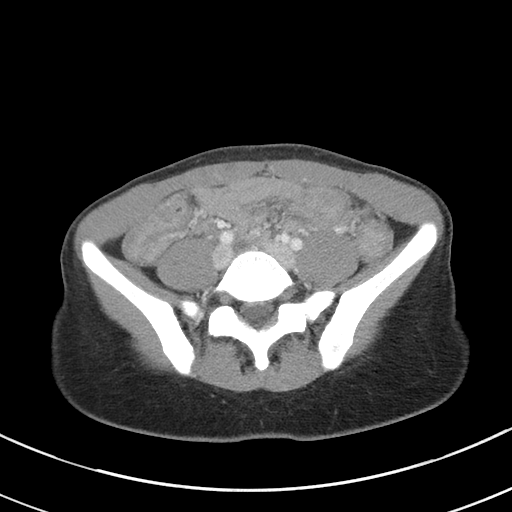
[im 41/79  soft-tissue]
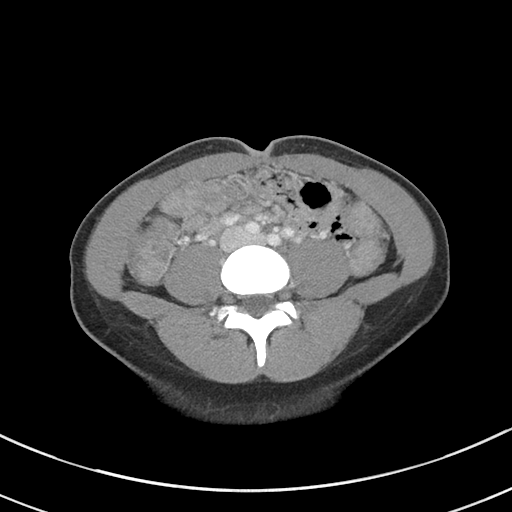
[im 44/79  soft-tissue]
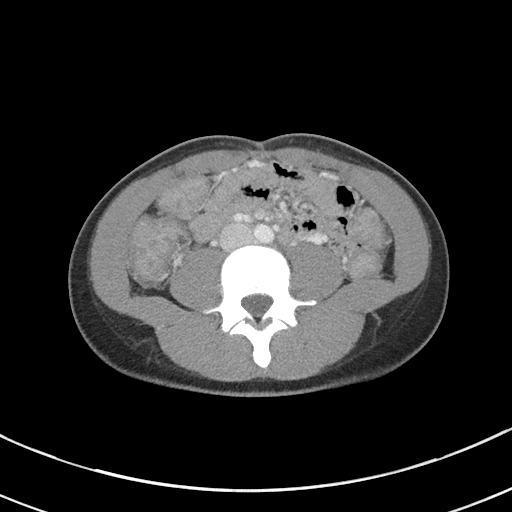
[im 50/79  soft-tissue]
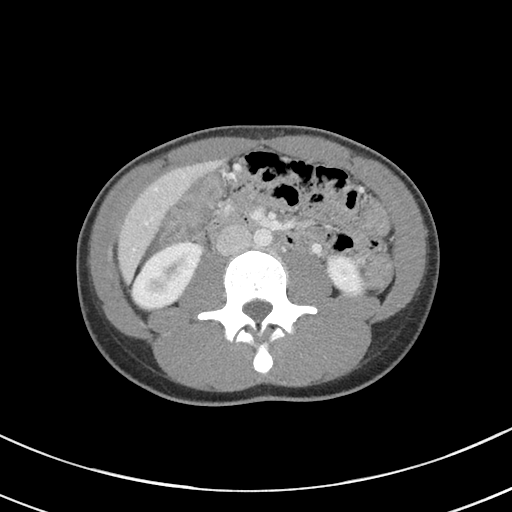
[im 50/79  bone]
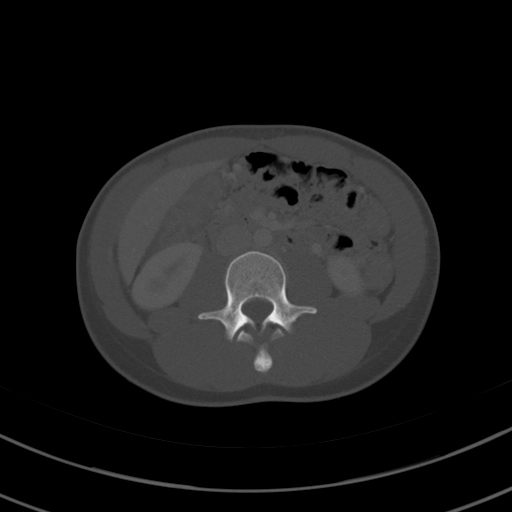
[im 57/79  soft-tissue]
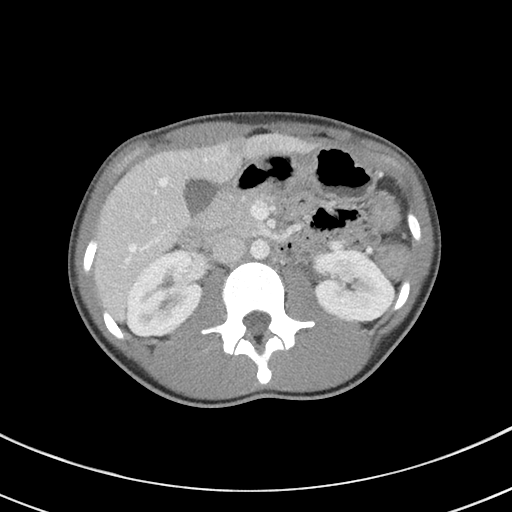
[im 63/79  soft-tissue]
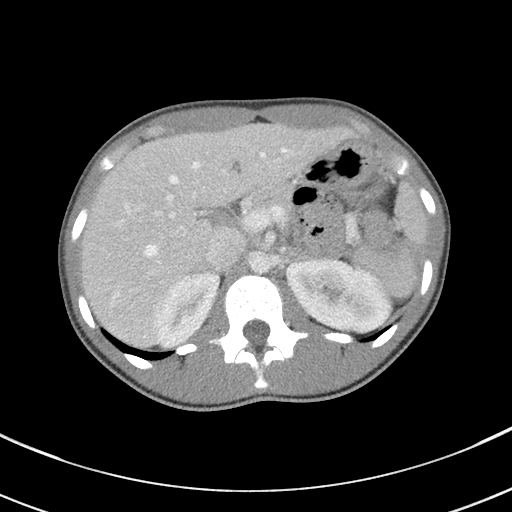
[im 69/79  soft-tissue]
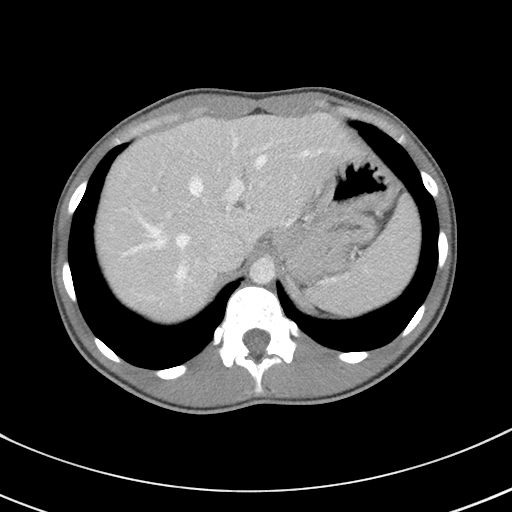
[im 75/79  soft-tissue]
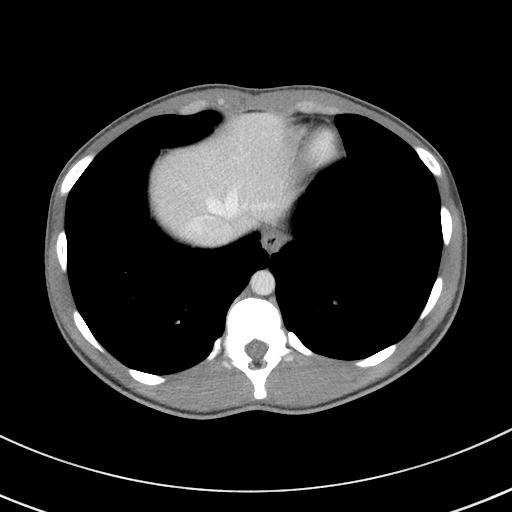

[Series 5: coronal st · coronal · 0.67mm/px · 3 of 75 slices shown]
[im 25/75  soft-tissue]
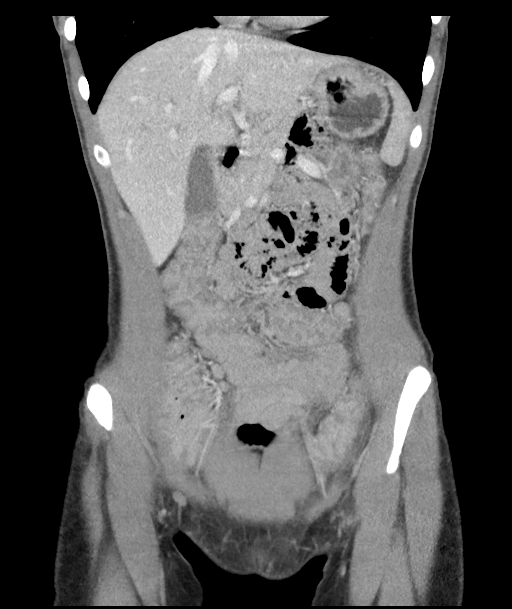
[im 33/75  soft-tissue]
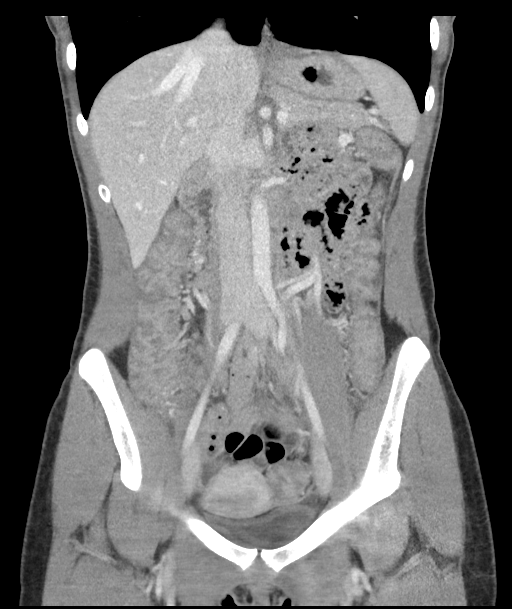
[im 42/75  soft-tissue]
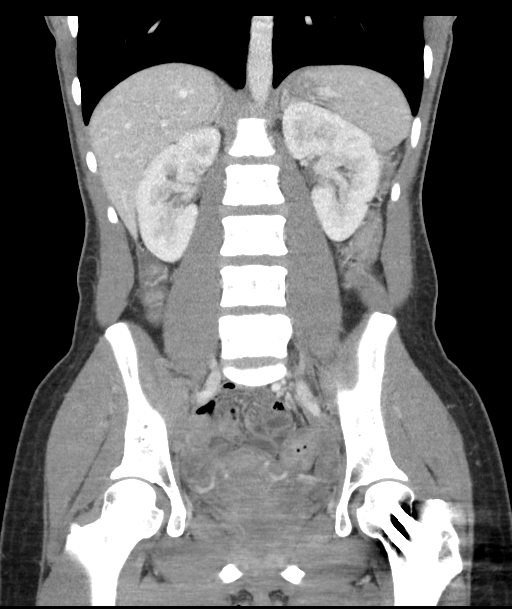

[16 of 46 positions shown; findings below may reference images not displayed]

FINDINGS: Lower chest: This lung bases are clear. No focal airspace disease or
pleural fluid.

Hepatobiliary: Tiny subcentimeter hypodensity in the right lobe of
the liver is too small to characterize. Minimal focal fatty
infiltration adjacent to the falciform ligament. Gallbladder
physiologically distended, no calcified stone. No biliary
dilatation.

Pancreas: No ductal dilatation or inflammation.

Spleen: Normal in size. Vague subcentimeter hypodensity in the
superior spleen is nonspecific.

Adrenals/Urinary Tract: No adrenal nodule. No hydronephrosis or
perinephric edema. Homogeneous renal enhancement. Urinary bladder is
nondistended and not well evaluated.

Stomach/Bowel: Bowel evaluation is limited in the absence of enteric
contrast and paucity of intra-abdominal fat. There is pan colonic
wall thickening and pericolonic edema from the cecum to the rectum
consistent with colitis. No obvious colonic diverticula. Limited
small bowel assessment without definite small bowel inflammation.
The stomach is decompressed. Appendix is tentatively visualized and
normal.

Vascular/Lymphatic: Normal caliber abdominal aorta. Patent portal
vein. The IMV is hypertrophied. Limited assessment for adenopathy.
No bulky enlarged lymph nodes.

Reproductive: Anteverted uterus. No evidence of adnexal mass.

Other: Small volume of free fluid in the pelvis. No free air or
evidence of perforation. No abscess.

Musculoskeletal: There are no acute or suspicious osseous
abnormalities. Intramedullary nail with trans trochanteric screw
fixation of left proximal femur is partially included. No sacroiliac
joint changes.
IMPRESSION: 1. Pancolitis from the cecum to the rectum, may be infectious or
inflammatory bowel disease. Distribution would favor ulcerative
colitis over Crohn's. Small bowel assessment is limited in the
absence of contrast and paucity of intra-abdominal fat.
2. Small volume of free fluid in the pelvis is likely reactive.

## 2022-01-04 ENCOUNTER — Ambulatory Visit: Payer: Medicaid Other

## 2022-03-29 ENCOUNTER — Other Ambulatory Visit (HOSPITAL_COMMUNITY)
Admission: RE | Admit: 2022-03-29 | Discharge: 2022-03-29 | Disposition: A | Discharge status: Home / Self Care | Payer: Medicaid Other | Source: Ambulatory Visit | Attending: Obstetrics & Gynecology | Admitting: Obstetrics & Gynecology

## 2022-03-29 ENCOUNTER — Ambulatory Visit: Payer: Medicaid Other | Admitting: *Deleted

## 2022-03-29 VITALS — BP 110/70 | HR 94 | Wt 135.0 lb

## 2022-03-29 DIAGNOSIS — N898 Other specified noninflammatory disorders of vagina: Secondary | ICD-10-CM

## 2022-03-29 DIAGNOSIS — B9689 Other specified bacterial agents as the cause of diseases classified elsewhere: Secondary | ICD-10-CM

## 2022-03-29 DIAGNOSIS — Z113 Encounter for screening for infections with a predominantly sexual mode of transmission: Secondary | ICD-10-CM

## 2022-03-29 DIAGNOSIS — B3731 Acute candidiasis of vulva and vagina: Secondary | ICD-10-CM

## 2022-03-29 NOTE — Progress Notes (Cosign Needed)
SUBJECTIVE:  ?21 y.o. female complains of vaginal itching for 2 day(s). ?Denies abnormal vaginal bleeding or significant pelvic pain or ?fever. No UTI symptoms. Denies history of known exposure to STD. ? ?No LMP recorded. ? ?OBJECTIVE:  ?She appears well, afebrile. ?Urine dipstick: not done. ? ?ASSESSMENT:  ?Vaginal Itching  ? ? ?PLAN:  ?BVAG, CVAG probe sent to lab. ?Treatment: To be determined once lab results are received ?ROV prn if symptoms persist or worsen.  ? ?Scheryl Marten, RN  ?

## 2022-04-03 ENCOUNTER — Telehealth: Payer: Self-pay

## 2022-04-03 MED ORDER — METRONIDAZOLE 500 MG PO TABS: 500.0000 mg | ORAL_TABLET | Freq: Two times a day (BID) | ORAL | 0 refills | Status: AC

## 2022-04-03 MED ORDER — FLUCONAZOLE 150 MG PO TABS: 150.0000 mg | ORAL_TABLET | Freq: Once | ORAL | 3 refills | Status: AC

## 2022-04-03 NOTE — Addendum Note (Signed)
Addended by: Kennon Portela on: 04/03/2022 01:14 PM ? ? Modules accepted: Orders ? ?

## 2022-04-03 NOTE — Telephone Encounter (Signed)
TC to pt to make her aware I contacted [facility] and found out pt swab had not be thrown out yet and added STD's on swab order was faxed to [phone] as directed  ?Pt advised to wait a few days for results. ?Pt agreeable and voiced understanding.  ? ? ? ?

## 2022-04-03 NOTE — Addendum Note (Signed)
Addended by: Jaynie Collins A on: 04/03/2022 09:14 AM ? ? Modules accepted: Orders ? ?

## 2022-04-03 NOTE — Telephone Encounter (Signed)
Pt called in upset about not receiving std results through mychart. Notified pt nurse is currently seeing pt and will follow up when she is free. Pt expressed scheduling the same appointment for different results. Advised pt that she schedule an annual but pt wants to schedule a self swab instead. Pt disconnected call.  ?

## 2022-04-04 LAB — CERVICOVAGINAL ANCILLARY ONLY
Bacterial Vaginitis (gardnerella): POSITIVE — AB
Candida Glabrata: NEGATIVE
Candida Vaginitis: POSITIVE — AB
Chlamydia: NEGATIVE
Comment: NEGATIVE
Comment: NEGATIVE
Comment: NEGATIVE
Comment: NEGATIVE
Comment: NEGATIVE
Comment: NORMAL
Neisseria Gonorrhea: NEGATIVE
Trichomonas: NEGATIVE

## 2022-04-04 MED ORDER — FLUCONAZOLE 150 MG PO TABS: 150.0000 mg | ORAL_TABLET | Freq: Once | ORAL | 3 refills | Status: AC

## 2022-04-04 NOTE — Addendum Note (Signed)
Addended by: Verita Schneiders A on: 04/04/2022 01:39 PM ? ? Modules accepted: Orders ? ?

## 2022-05-10 ENCOUNTER — Encounter: Payer: Self-pay | Admitting: Obstetrics and Gynecology

## 2022-05-10 ENCOUNTER — Ambulatory Visit (INDEPENDENT_AMBULATORY_CARE_PROVIDER_SITE_OTHER): Payer: Medicaid Other | Admitting: Obstetrics and Gynecology

## 2022-05-10 VITALS — BP 101/65 | HR 109 | Wt 139.0 lb

## 2022-05-10 DIAGNOSIS — Z30011 Encounter for initial prescription of contraceptive pills: Secondary | ICD-10-CM

## 2022-05-10 DIAGNOSIS — Z01419 Encounter for gynecological examination (general) (routine) without abnormal findings: Secondary | ICD-10-CM

## 2022-05-10 DIAGNOSIS — K529 Noninfective gastroenteritis and colitis, unspecified: Secondary | ICD-10-CM

## 2022-05-10 MED ORDER — NORETHIN ACE-ETH ESTRAD-FE 1-20 MG-MCG(24) PO TABS: 1.0000 | ORAL_TABLET | Freq: Every day | ORAL | 3 refills | Status: DC

## 2022-05-10 NOTE — Progress Notes (Signed)
Obstetrics and Gynecology Annual  Patient Evaluation  Appointment Date: 05/10/2022  OBGYN Clinic: [facility] for [facility]   Chief Complaint: Annual, contraception management  History of Present Illness: Mackenzie Rios is a 21 y.o. G0 (Patient's last menstrual period was 04/22/2022 (exact date).), seen for the above chief complaint. Her past medical history is significant for inflammatory BD.     Review of Systems: A comprehensive review of systems was negative.   Past Medical History:  Past Medical History:  Diagnosis Date   Colitis     Past Surgical History:  Past Surgical History:  Procedure Laterality Date   BIOPSY  11/10/2021   Procedure: BIOPSY;  Surgeon: Willis Modena, MD;  Location: [facility];  Service: Endoscopy;;   FEMUR CLOSED REDUCTION     FLEXIBLE SIGMOIDOSCOPY N/A 11/10/2021   Procedure: FLEXIBLE SIGMOIDOSCOPY;  Surgeon: Willis Modena, MD;  Location: [facility];  Service: Endoscopy;  Laterality: N/A;    Past Obstetrical History:  OB History  Gravida Para Term Preterm AB Living  0 0 0 0 0 0  SAB IAB Ectopic Multiple Live Births  0 0 0 0 0    Past Gynecological History: As per HPI. Periods: qmonth, regular, 4 days, LMP 5/21 History of Pap Smear(s): Yes.   Last pap wnl, which was 09/2020 She is currently using no method for contraception. H/o depo provera x 7 years  Social History:  Social History   Socioeconomic History   Marital status: Single    Spouse name: Not on file   Number of children: Not on file   Years of education: Not on file   Highest education level: Not on file  Occupational History   Not on file  Tobacco Use   Smoking status: Never   Smokeless tobacco: Never  Vaping Use   Vaping Use: Never used  Substance and Sexual Activity   Alcohol use: Yes    Comment: occassional   Drug use: Yes    Types: Marijuana   Sexual activity: Yes    Birth control/protection: None  Other Topics Concern   Not on file   Social History Narrative   Not on file   Social Determinants of Health   Financial Resource Strain: Not on file  Food Insecurity: Not on file  Transportation Needs: Not on file  Physical Activity: Not on file  Stress: Not on file  Social Connections: Not on file  Intimate Partner Violence: Not on file    Medications None  Allergies Patient has no known allergies.   Physical Exam:  BP 101/65   Pulse (!) 109   Wt 139 lb (63 kg)   LMP 04/22/2022 (Exact Date)   BMI 23.86 kg/m  Body mass index is 23.86 kg/m. General appearance: Well nourished, well developed female in no acute distress.  Cardiovascular: normal s1 and s2.  No murmurs, rubs or gallops. Respiratory:  Clear to auscultation bilateral. Normal respiratory effort Abdomen: positive bowel sounds and no masses, hernias; diffusely non tender to palpation, non distended Neuro/Psych:  Normal mood and affect.  Skin:  Warm and dry.   Pelvic exam: declined  Laboratory: none  Radiology: none  Assessment: pt doing well  Plan:  1. Well woman exam with routine gynecological exam Routine care. Needs pap next year. Patient has no contraindications to any birth control. Options d/w her and she would like to try combined OCPs. Sex a few days ago so recommend starting with next period. Loestrin sent in   RTC PRN  Durene Romans MD Attending Center for [employer] [facility])

## 2022-05-10 NOTE — Progress Notes (Signed)
Would like to discuss BC, possibly the pills   Last unprotected sex was 3 days ago

## 2022-05-11 ENCOUNTER — Encounter: Payer: Self-pay | Admitting: Obstetrics and Gynecology

## 2022-07-06 ENCOUNTER — Encounter: Payer: Self-pay | Admitting: Obstetrics and Gynecology

## 2022-07-09 ENCOUNTER — Other Ambulatory Visit: Payer: Self-pay | Admitting: *Deleted

## 2022-07-09 MED ORDER — FLUCONAZOLE 150 MG PO TABS: 150.0000 mg | ORAL_TABLET | Freq: Once | ORAL | 3 refills | Status: AC

## 2022-08-28 ENCOUNTER — Ambulatory Visit: Payer: Medicaid Other

## 2022-08-30 ENCOUNTER — Ambulatory Visit: Payer: Medicaid Other

## 2022-08-30 ENCOUNTER — Other Ambulatory Visit (HOSPITAL_COMMUNITY)
Admission: RE | Admit: 2022-08-30 | Discharge: 2022-08-30 | Disposition: A | Discharge status: Home / Self Care | Payer: Medicaid Other | Source: Ambulatory Visit | Attending: Obstetrics & Gynecology | Admitting: Obstetrics & Gynecology

## 2022-08-30 ENCOUNTER — Ambulatory Visit (INDEPENDENT_AMBULATORY_CARE_PROVIDER_SITE_OTHER): Payer: Medicaid Other

## 2022-08-30 VITALS — BP 116/77 | HR 99 | Wt 136.0 lb

## 2022-08-30 DIAGNOSIS — B3731 Acute candidiasis of vulva and vagina: Secondary | ICD-10-CM | POA: Diagnosis not present

## 2022-08-30 DIAGNOSIS — Z113 Encounter for screening for infections with a predominantly sexual mode of transmission: Secondary | ICD-10-CM | POA: Diagnosis not present

## 2022-08-30 DIAGNOSIS — A749 Chlamydial infection, unspecified: Secondary | ICD-10-CM

## 2022-08-30 NOTE — Progress Notes (Signed)
SUBJECTIVE:  21 y.o. female who desires a STI screen. Denies abnormal vaginal discharge, bleeding or significant pelvic pain. No UTI symptoms. Denies history of known exposure to STD.  Patient's last menstrual period was 08/09/2022.  OBJECTIVE:  She appears well.   ASSESSMENT:  STI Screen   PLAN:  Pt offered STI blood screening-not indicated GC, chlamydia, and trichomonas ,BV and yeast probe sent to lab.  Treatment: To be determined once lab results are received.  Pt follow up as needed.

## 2022-08-31 LAB — CERVICOVAGINAL ANCILLARY ONLY
Bacterial Vaginitis (gardnerella): NEGATIVE
Candida Glabrata: NEGATIVE
Candida Vaginitis: POSITIVE — AB
Chlamydia: POSITIVE — AB
Comment: NEGATIVE
Comment: NEGATIVE
Comment: NEGATIVE
Comment: NEGATIVE
Comment: NEGATIVE
Comment: NORMAL
Neisseria Gonorrhea: NEGATIVE
Trichomonas: NEGATIVE

## 2022-09-03 ENCOUNTER — Encounter: Payer: Self-pay | Admitting: Obstetrics & Gynecology

## 2022-09-03 DIAGNOSIS — A749 Chlamydial infection, unspecified: Secondary | ICD-10-CM | POA: Insufficient documentation

## 2022-09-03 MED ORDER — DOXYCYCLINE HYCLATE 100 MG PO CAPS: 100.0000 mg | ORAL_CAPSULE | Freq: Two times a day (BID) | ORAL | 1 refills | Status: AC

## 2022-09-03 MED ORDER — FLUCONAZOLE 150 MG PO TABS: 150.0000 mg | ORAL_TABLET | Freq: Once | ORAL | 3 refills | Status: AC

## 2022-09-03 NOTE — Progress Notes (Signed)
Patient has chlamydia.  Recommend serum testing for other STIs, also needs to let partner(s) know so the partner(s) can get testing and treatment. Patient and sex partner(s) should abstain from unprotected sexual activity for seven days after everyone receives appropriate treatment.  Doxycycline  was prescribed for patient.  Patient will need to return in about 4 weeks after treatment for repeat test of cure. Of note, patient also has a yeast infection for which Diflucan was prescribed.  Please call to inform patient of results and recommendations, and advise to pick up prescription and take as directed.  Please advise patient to practice safe sex at all times.  Verita Schneiders, MD

## 2022-09-03 NOTE — Addendum Note (Signed)
Addended by: Verita Schneiders A on: 09/03/2022 11:48 AM   Modules accepted: Orders

## 2022-09-08 ENCOUNTER — Encounter: Payer: Self-pay | Admitting: Obstetrics and Gynecology

## 2022-09-12 ENCOUNTER — Telehealth: Payer: Self-pay

## 2022-09-12 NOTE — Telephone Encounter (Signed)
Called pt to notify of message from [facility] regarding appointment being scheduled to soon. Cancelled pt appt and left vm for her to call office back to reschedule.

## 2022-09-13 ENCOUNTER — Ambulatory Visit: Payer: Medicaid Other

## 2022-10-18 ENCOUNTER — Ambulatory Visit
Admission: RE | Admit: 2022-10-18 | Discharge: 2022-10-18 | Disposition: A | Discharge status: Home / Self Care | Payer: BC Managed Care – PPO | Source: Ambulatory Visit | Attending: Gastroenterology | Admitting: Gastroenterology

## 2022-10-18 ENCOUNTER — Other Ambulatory Visit: Payer: Self-pay | Admitting: Gastroenterology

## 2022-10-18 DIAGNOSIS — A159 Respiratory tuberculosis unspecified: Secondary | ICD-10-CM

## 2022-10-18 DIAGNOSIS — K5100 Ulcerative (chronic) pancolitis without complications: Secondary | ICD-10-CM

## 2022-10-29 ENCOUNTER — Other Ambulatory Visit: Payer: Self-pay

## 2022-10-29 ENCOUNTER — Inpatient Hospital Stay (HOSPITAL_BASED_OUTPATIENT_CLINIC_OR_DEPARTMENT_OTHER)
Admission: EM | Admit: 2022-10-29 | Discharge: 2022-10-31 | DRG: 386 | Disposition: A | Discharge status: Home / Self Care | Payer: BC Managed Care – PPO | Attending: Internal Medicine | Admitting: Internal Medicine

## 2022-10-29 ENCOUNTER — Emergency Department (HOSPITAL_BASED_OUTPATIENT_CLINIC_OR_DEPARTMENT_OTHER): Payer: BC Managed Care – PPO

## 2022-10-29 ENCOUNTER — Encounter (HOSPITAL_BASED_OUTPATIENT_CLINIC_OR_DEPARTMENT_OTHER): Payer: Self-pay | Admitting: Urology

## 2022-10-29 DIAGNOSIS — E876 Hypokalemia: Secondary | ICD-10-CM | POA: Diagnosis not present

## 2022-10-29 DIAGNOSIS — D72828 Other elevated white blood cell count: Secondary | ICD-10-CM | POA: Diagnosis present

## 2022-10-29 DIAGNOSIS — E8809 Other disorders of plasma-protein metabolism, not elsewhere classified: Secondary | ICD-10-CM | POA: Diagnosis present

## 2022-10-29 DIAGNOSIS — K529 Noninfective gastroenteritis and colitis, unspecified: Secondary | ICD-10-CM | POA: Diagnosis not present

## 2022-10-29 DIAGNOSIS — K5190 Ulcerative colitis, unspecified, without complications: Secondary | ICD-10-CM | POA: Diagnosis present

## 2022-10-29 DIAGNOSIS — K51911 Ulcerative colitis, unspecified with rectal bleeding: Secondary | ICD-10-CM | POA: Diagnosis present

## 2022-10-29 DIAGNOSIS — Z91148 Patient's other noncompliance with medication regimen for other reason: Secondary | ICD-10-CM

## 2022-10-29 DIAGNOSIS — Z79899 Other long term (current) drug therapy: Secondary | ICD-10-CM

## 2022-10-29 DIAGNOSIS — D72829 Elevated white blood cell count, unspecified: Secondary | ICD-10-CM

## 2022-10-29 DIAGNOSIS — D62 Acute posthemorrhagic anemia: Secondary | ICD-10-CM | POA: Diagnosis present

## 2022-10-29 DIAGNOSIS — D509 Iron deficiency anemia, unspecified: Secondary | ICD-10-CM | POA: Diagnosis present

## 2022-10-29 DIAGNOSIS — D649 Anemia, unspecified: Secondary | ICD-10-CM | POA: Diagnosis not present

## 2022-10-29 DIAGNOSIS — T380X6A Underdosing of glucocorticoids and synthetic analogues, initial encounter: Secondary | ICD-10-CM | POA: Diagnosis present

## 2022-10-29 DIAGNOSIS — Z7952 Long term (current) use of systemic steroids: Secondary | ICD-10-CM | POA: Diagnosis not present

## 2022-10-29 DIAGNOSIS — K922 Gastrointestinal hemorrhage, unspecified: Principal | ICD-10-CM

## 2022-10-29 LAB — URINALYSIS, ROUTINE W REFLEX MICROSCOPIC
Bilirubin Urine: NEGATIVE
Bilirubin Urine: NEGATIVE
Glucose, UA: NEGATIVE mg/dL
Glucose, UA: NEGATIVE mg/dL
Hgb urine dipstick: NEGATIVE
Ketones, ur: 15 mg/dL — AB
Ketones, ur: 5 mg/dL — AB
Leukocytes,Ua: NEGATIVE
Leukocytes,Ua: NEGATIVE
Nitrite: NEGATIVE
Nitrite: NEGATIVE
Protein, ur: 30 mg/dL — AB
Protein, ur: NEGATIVE mg/dL
Specific Gravity, Urine: >=1.03 (ref 1.005–1.030)
Specific Gravity, Urine: 1.01 (ref 1.005–1.030)
pH: 5.5 (ref 5.0–8.0)
pH: 5 (ref 5.0–8.0)

## 2022-10-29 LAB — COMPREHENSIVE METABOLIC PANEL
ALT: 10 U/L (ref 0–44)
AST: 16 U/L (ref 15–41)
Albumin: 3.4 g/dL — ABNORMAL LOW (ref 3.5–5.0)
Alkaline Phosphatase: 58 U/L (ref 38–126)
Anion gap: 9 (ref 5–15)
BUN: 7 mg/dL (ref 6–20)
CO2: 21 mmol/L — ABNORMAL LOW (ref 22–32)
Calcium: 8.7 mg/dL — ABNORMAL LOW (ref 8.9–10.3)
Chloride: 111 mmol/L (ref 98–111)
Creatinine, Ser: 0.7 mg/dL (ref 0.44–1.00)
GFR, Estimated: >60 mL/min (ref >60)
Glucose, Bld: 134 mg/dL — ABNORMAL HIGH (ref 70–99)
Potassium: 3.4 mmol/L — ABNORMAL LOW (ref 3.5–5.1)
Sodium: 141 mmol/L (ref 135–145)
Total Bilirubin: 0.2 mg/dL — ABNORMAL LOW (ref 0.3–1.2)
Total Protein: 7.8 g/dL (ref 6.5–8.1)

## 2022-10-29 LAB — URINALYSIS, MICROSCOPIC (REFLEX)

## 2022-10-29 LAB — CBC
HCT: 23 % — ABNORMAL LOW (ref 36.0–46.0)
Hemoglobin: 5.7 g/dL — CL (ref 12.0–15.0)
MCH: 15.9 pg — ABNORMAL LOW (ref 26.0–34.0)
MCHC: 24.8 g/dL — ABNORMAL LOW (ref 30.0–36.0)
MCV: 64.1 fL — ABNORMAL LOW (ref 80.0–100.0)
Platelets: 668 10*3/uL — ABNORMAL HIGH (ref 150–400)
RBC: 3.59 MIL/uL — ABNORMAL LOW (ref 3.87–5.11)
RDW: 22.1 % — ABNORMAL HIGH (ref 11.5–15.5)
WBC: 15.9 10*3/uL — ABNORMAL HIGH (ref 4.0–10.5)
nRBC: 0 % (ref 0.0–0.2)

## 2022-10-29 LAB — LIPASE, BLOOD: Lipase: 25 U/L (ref 11–51)

## 2022-10-29 LAB — C DIFFICILE QUICK SCREEN W PCR REFLEX
C Diff antigen: NEGATIVE
C Diff interpretation: NOT DETECTED
C Diff toxin: NEGATIVE

## 2022-10-29 LAB — PREPARE RBC (CROSSMATCH)

## 2022-10-29 LAB — MAGNESIUM: Magnesium: 2 mg/dL (ref 1.7–2.4)

## 2022-10-29 LAB — PREGNANCY, URINE: Preg Test, Ur: NEGATIVE

## 2022-10-29 LAB — PHOSPHORUS: Phosphorus: 3.3 mg/dL (ref 2.5–4.6)

## 2022-10-29 MED ORDER — ACETAMINOPHEN 325 MG PO TABS
650.0000 mg | ORAL_TABLET | Freq: Once | ORAL | Status: DC
  Filled 2022-10-29: qty 2

## 2022-10-29 MED ORDER — ONDANSETRON HCL 4 MG/2ML IJ SOLN: 4.0000 mg | Freq: Four times a day (QID) | INTRAMUSCULAR | Status: DC | PRN

## 2022-10-29 MED ORDER — MORPHINE SULFATE (PF) 2 MG/ML IV SOLN: 1.0000 mg | INTRAVENOUS | Status: DC | PRN

## 2022-10-29 MED ORDER — MAGNESIUM CITRATE PO SOLN: 1.0000 | Freq: Once | ORAL | Status: DC | PRN

## 2022-10-29 MED ORDER — IOHEXOL 300 MG/ML  SOLN
100.0000 mL | Freq: Once | INTRAMUSCULAR | Status: AC | PRN
  Administered 2022-10-29: 100 mL via INTRAVENOUS

## 2022-10-29 MED ORDER — SODIUM CHLORIDE 0.9% FLUSH
3.0000 mL | Freq: Two times a day (BID) | INTRAVENOUS | Status: DC
  Administered 2022-10-29 – 2022-10-31 (×4): 3 mL via INTRAVENOUS

## 2022-10-29 MED ORDER — SORBITOL 70 % SOLN: 30.0000 mL | Freq: Every day | Status: DC | PRN

## 2022-10-29 MED ORDER — ONDANSETRON HCL 4 MG PO TABS: 4.0000 mg | ORAL_TABLET | Freq: Four times a day (QID) | ORAL | Status: DC | PRN

## 2022-10-29 MED ORDER — OXYCODONE HCL 5 MG PO TABS: 5.0000 mg | ORAL_TABLET | ORAL | Status: DC | PRN

## 2022-10-29 MED ORDER — METOPROLOL TARTRATE 5 MG/5ML IV SOLN: 5.0000 mg | Freq: Four times a day (QID) | INTRAVENOUS | Status: DC | PRN

## 2022-10-29 MED ORDER — SODIUM CHLORIDE 0.9 % IV SOLN
10.0000 mL/h | Freq: Once | INTRAVENOUS | Status: AC
  Administered 2022-10-29: 10 mL/h via INTRAVENOUS

## 2022-10-29 MED ORDER — ACETAMINOPHEN 650 MG RE SUPP: 650.0000 mg | Freq: Four times a day (QID) | RECTAL | Status: DC | PRN

## 2022-10-29 MED ORDER — ACETAMINOPHEN 325 MG PO TABS
650.0000 mg | ORAL_TABLET | Freq: Four times a day (QID) | ORAL | Status: DC | PRN
  Administered 2022-10-29: 650 mg via ORAL

## 2022-10-29 MED ORDER — POTASSIUM CHLORIDE CRYS ER 10 MEQ PO TBCR
40.0000 meq | EXTENDED_RELEASE_TABLET | Freq: Once | ORAL | Status: AC
  Administered 2022-10-29: 40 meq via ORAL
  Filled 2022-10-29: qty 4

## 2022-10-29 MED ORDER — SENNOSIDES-DOCUSATE SODIUM 8.6-50 MG PO TABS: 1.0000 | ORAL_TABLET | Freq: Every evening | ORAL | Status: DC | PRN

## 2022-10-29 MED ORDER — METHYLPREDNISOLONE SODIUM SUCC 40 MG IJ SOLR
20.0000 mg | Freq: Three times a day (TID) | INTRAMUSCULAR | Status: DC
  Administered 2022-10-29 – 2022-10-31 (×7): 20 mg via INTRAVENOUS
  Filled 2022-10-29 (×7): qty 1

## 2022-10-29 MED ORDER — SODIUM CHLORIDE 0.9 % IV SOLN: INTRAVENOUS | Status: DC

## 2022-10-29 MED ORDER — ACETAMINOPHEN 500 MG PO TABS: 1000.0000 mg | ORAL_TABLET | Freq: Four times a day (QID) | ORAL | Status: DC | PRN

## 2022-10-29 NOTE — ED Triage Notes (Signed)
Pt states she feels like she is having heart palpitation x 1 week  Also reports UC flare up with bloody stools x 1 month  States SOB and fatigue Had same concern last year and needed blood transfusion

## 2022-10-29 NOTE — ED Notes (Signed)
Pt transferred to ED from [facility]. Pt here for a GI bleed, hx of same, periodic transfusions in the past.

## 2022-10-29 NOTE — ED Provider Notes (Signed)
[facility] EMERGENCY DEPARTMENT Provider Note   CSN: [id number] Arrival date & time: 10/29/22  1004     History  Chief Complaint  Patient presents with   Rectal Bleeding    Mackenzie Rios is a 21 y.o. female.  HPI     History of UC  Fatigue for one week, feels like prior anemia, lightheadedness  When on prednisone can't see blood but now is more blood than stool, mucus and blood, past 2 weeks with blood, maroon, sometimes bright red, stool itself is brown, not black or tarry, passing some clots Having diarrhea/loose stool, not as frequent as it was 3 times a day (last year was 10 times/day) Nausea, no vomiting Abdominal pain 3/10, anytime eat or drink will cramp No fever No urinary symptoms On period now, not heavy   Ran out of prednisone 2 weeks ago [facility]   Past Medical History:  Diagnosis Date   Chlamydia infection    Colitis      Home Medications Prior to Admission medications   Medication Sig Start Date End Date Taking? Authorizing Provider  acetaminophen (TYLENOL) 500 MG tablet Take 1,000 mg by mouth every 6 (six) hours as needed for moderate pain or mild pain. Patient not taking: Reported on 10/29/2022    [provider]  Norethindrone Acetate-Ethinyl Estrad-FE (LOESTRIN 24 FE) 1-20 MG-MCG(24) tablet Take 1 tablet by mouth daily. Patient not taking: Reported on 10/29/2022 05/10/22   Libby Bing, MD      Allergies    Patient has no known allergies.    Review of Systems   Review of Systems  Physical Exam Updated Vital Signs BP (!) 140/94 (BP Location: Right Arm)   Pulse 100   Temp 98.2 F (36.8 C) (Oral)   Resp 16   Ht 5\' 4"  (1.626 m)   Wt 63.5 kg   LMP 10/26/2022 (Exact Date) Comment: neg upreg in er today.  SpO2 100%   BMI 24.03 kg/m  Physical Exam Vitals and nursing note reviewed.  Constitutional:      General: She is not in acute distress.    Appearance: She is well-developed. She is not diaphoretic.   HENT:     Head: Normocephalic and atraumatic.  Eyes:     Conjunctiva/sclera: Conjunctivae normal.  Cardiovascular:     Rate and Rhythm: Normal rate and regular rhythm.     Pulses: Normal pulses.  Pulmonary:     Effort: Pulmonary effort is normal. No respiratory distress.  Abdominal:     General: There is no distension.     Palpations: Abdomen is soft.     Tenderness: There is abdominal tenderness (mild). There is no guarding.  Musculoskeletal:        General: No tenderness.     Cervical back: Normal range of motion.  Skin:    General: Skin is warm and dry.     Findings: No erythema or rash.  Neurological:     Mental Status: She is alert and oriented to person, place, and time.     ED Results / Procedures / Treatments   Labs (all labs ordered are listed, but only abnormal results are displayed) Labs Reviewed  COMPREHENSIVE METABOLIC PANEL - Abnormal; Notable for the following components:      Result Value   Potassium 3.4 (*)    CO2 21 (*)    Glucose, Bld 134 (*)    Calcium 8.7 (*)    Albumin 3.4 (*)    Total Bilirubin  0.2 (*)    All other components within normal limits  CBC - Abnormal; Notable for the following components:   WBC 15.9 (*)    RBC 3.59 (*)    Hemoglobin 5.7 (*)    HCT 23.0 (*)    MCV 64.1 (*)    MCH 15.9 (*)    MCHC 24.8 (*)    RDW 22.1 (*)    Platelets 668 (*)    All other components within normal limits  URINALYSIS, ROUTINE W REFLEX MICROSCOPIC - Abnormal; Notable for the following components:   Hgb urine dipstick MODERATE (*)    Ketones, ur 15 (*)    Protein, ur 30 (*)    All other components within normal limits  URINALYSIS, MICROSCOPIC (REFLEX) - Abnormal; Notable for the following components:   Bacteria, UA MANY (*)    All other components within normal limits  URINALYSIS, ROUTINE W REFLEX MICROSCOPIC - Abnormal; Notable for the following components:   Ketones, ur 5 (*)    All other components within normal limits  C DIFFICILE QUICK  SCREEN W PCR REFLEX    CALPROTECTIN, FECAL  GASTROINTESTINAL PANEL BY PCR, STOOL (REPLACES STOOL CULTURE)  URINE CULTURE  LIPASE, BLOOD  PREGNANCY, URINE  VITAMIN B12  FOLATE  IRON AND TIBC  FERRITIN  MAGNESIUM  PHOSPHORUS  HEPATITIS B CORE ANTIBODY, IGM  HEPATITIS B SURFACE ANTIGEN  COMPREHENSIVE METABOLIC PANEL  CBC WITH DIFFERENTIAL/PLATELET  PREPARE RBC (CROSSMATCH)  TYPE AND SCREEN    EKG EKG Interpretation  Date/Time:  Monday October 29 2022 10:12:46 EST Ventricular Rate:  118 PR Interval:  118 QRS Duration: 76 QT Interval:  286 QTC Calculation: 400 R Axis:   68 Text Interpretation: Sinus tachycardia Nonspecific ST and T wave abnormality Abnormal ECG No previous ECGs available Confirmed by Alvira Monday ([id number]) on 10/29/2022 11:36:54 AM  Radiology CT ABDOMEN PELVIS W CONTRAST  Result Date: 10/29/2022 CLINICAL DATA:  Ulcer of colitis, bloody stool. Heart palpitations, short of breath EXAM: CT ABDOMEN AND PELVIS WITH CONTRAST TECHNIQUE: Multidetector CT imaging of the abdomen and pelvis was performed using the standard protocol following bolus administration of intravenous contrast. RADIATION DOSE REDUCTION: This exam was performed according to the departmental dose-optimization program which includes automated exposure control, adjustment of the mA and/or kV according to patient size and/or use of iterative reconstruction technique. CONTRAST:  OMNIPAQUE IOHEXOL 300 MG/ML  SOLN COMPARISON:  None Available. FINDINGS: Lower chest: Lung bases are clear. Hepatobiliary: No focal hepatic lesion. Gallbladder normal. No biliary duct dilatation. Common bile duct is normal. Pancreas: Pancreas is normal. No ductal dilatation. No pancreatic inflammation. Spleen: Normal spleen Adrenals/urinary tract: Adrenal glands and kidneys are normal. The ureters and bladder normal. Stomach/Bowel: The stomach, duodenum small-bowel normal. Terminal ileum normal. Appendix normal. There is  formed stool in the ascending colon. Beginning in the descending colon there is mild mucosal thickening. There is mucosal enhancement in the distal sigmoid colon and rectum. There is fluid stool and formed stool in the rectum. No evidence of abscess or fistula. Minimal inflammation in the: Mesenteries. Vascular/Lymphatic: Abdominal aorta normal.  No lymphadenopathy Reproductive: Uterus and adnexa unremarkable. Other: Small amount free fluid in the RIGHT lower quadrant adjacent the small bowel (coronal image 55/5) Musculoskeletal: No aggressive osseous lesion. SI joints normal. Internal fixation of the LEFT femoral neck. IMPRESSION: 1. Mild mucosal thickening and enhancement of the rectum, sigmoid colon and descending colon. Findings similar comparison CT 11/08/2021. No abscess or fistula. No significant mesenteric inflammation or adenopathy.  2. Formed stool and fluid stool in the rectum. 3. Small amount free fluid RIGHT lower quadrant adjacent small bowel. 4. Normal appendix. 5. Small bowel appears normal. Electronically Signed   By: Genevive BiStewart  Edmunds M.D.   On: 10/29/2022 13:08    Procedures .Critical Care  Performed by: Alvira MondaySchlossman, Davine Sweney, MD Authorized by: Alvira MondaySchlossman, Cabell Lazenby, MD   Critical care provider statement:    Critical care time (minutes):  30   Critical care was time spent personally by me on the following activities:  Development of treatment plan with patient or surrogate, discussions with consultants, examination of patient, ordering and review of laboratory studies, ordering and performing treatments and interventions, pulse oximetry, re-evaluation of patient's condition and review of old charts     Medications Ordered in ED Medications  methylPREDNISolone sodium succinate (SOLU-MEDROL) 40 mg/mL injection 20 mg (20 mg Intravenous Given 10/29/22 1609)  acetaminophen (TYLENOL) tablet 650 mg (650 mg Oral Not Given 10/29/22 1654)  sodium chloride flush (NS) 0.9 % injection 3 mL (has no  administration in time range)  0.9 %  sodium chloride infusion (has no administration in time range)  acetaminophen (TYLENOL) tablet 650 mg (650 mg Oral Given 10/29/22 1630)    Or  acetaminophen (TYLENOL) suppository 650 mg ( Rectal See Alternative 10/29/22 1630)  oxyCODONE (Oxy IR/ROXICODONE) immediate release tablet 5 mg (has no administration in time range)  morphine (PF) 2 MG/ML injection 1 mg (has no administration in time range)  senna-docusate (Senokot-S) tablet 1 tablet (has no administration in time range)  sorbitol 70 % solution 30 mL (has no administration in time range)  magnesium citrate solution 1 Bottle (has no administration in time range)  ondansetron (ZOFRAN) tablet 4 mg (has no administration in time range)    Or  ondansetron (ZOFRAN) injection 4 mg (has no administration in time range)  metoprolol tartrate (LOPRESSOR) injection 5 mg (has no administration in time range)  iohexol (OMNIPAQUE) 300 MG/ML solution 100 mL (100 mLs Intravenous Contrast Given 10/29/22 1246)  0.9 %  sodium chloride infusion (10 mL/hr Intravenous New Bag/Given 10/29/22 1442)  potassium chloride (KLOR-CON M) CR tablet 40 mEq (40 mEq Oral Given 10/29/22 1630)    ED Course/ Medical Decision Making/ A&P                            21yo female with history of UC with prior admission 11/2021 for symptomatic anemia with diagnosis previously on prednisone (ran out 2 weeks ago) who presents with 2 weeks of rectal bleeding, diarrhea and fatigue over the last week.  Labs completed and personally evaluated by me show no clinically significant renal injury or electrolyte abnormality, no pancreatitis.  Hgb 5.7 from 8.4 previously.  HR up to 120 after walking in however improved to 90s without intervention, BP WNL.  Do not feel emergency release blood indicated at this time but [facility].  Called [facility] and spoke with Dr. Lorenso QuarryVreeland.  Recommends CT and initiating  solumedrol 20mg  Q8hr.  Ordered solumedrol and CT. CT results pending at time of transfer, resulted with similar findings to previous with thickening of rectum, sigmoid colon and descending colon.    Discussed with ED, will transfer for admisison and blood transfusion in setting of symptomatic anemia and hgb 5.7.         Final Clinical Impression(s) / ED Diagnoses Final diagnoses:  Lower GI bleed  Ulcerative colitis with rectal bleeding,  unspecified location [facility])  Symptomatic anemia    Rx / DC Orders ED Discharge Orders     None         Alvira Monday, MD 10/29/22 2210

## 2022-10-29 NOTE — ED Notes (Signed)
Report given to [facility]. 

## 2022-10-29 NOTE — ED Notes (Signed)
Report given to Jesse Fall charge RN

## 2022-10-29 NOTE — H&P (Signed)
History and Physical    Mackenzie Rios VZD:[id number] DOB: 24-Mar-2001 DOA: 10/29/2022  PCP: Patient, No Pcp Per  Gastroenterologist: Dr. Hinda Lenis gastroenterology Patient coming from: Home  I have personally briefly reviewed patient's old medical records in [facility]  Chief Complaint: Rectal bleeding/shortness of breath/history of ulcerative colitis  HPI: Mackenzie Rios is a 21 y.o. female with medical history significant of ulcerative colitis diagnosed via flexible sigmoidoscopy 11/10/2021, noted to have been on prednisone 40 mg daily since diagnosis in December 2022 presenting to the [facility] ED with a 2-week history of diarrhea and bloody stools approximately 3-5 stools per day.  Patient noted to have run out of her prednisone 2 weeks prior to admission and noted to be on steroids.  Patient does endorse some shortness of breath, lightheadedness, fatigue, some abdominal pain.  Patient denies any fever, no chills, no hematemesis, no dysuria, no chest pain, no cough.  Patient subsequently transferred to the [facility] long ED.  ED Course: Patient seen in the ED, comprehensive metabolic profile with a potassium of 3.4, bicarb of 21, glucose of 134, calcium of 8.7, albumin of 3.4 otherwise within normal limits.  CBC with a white count of 15.9, hemoglobin of 5.7, platelet count of 668.  Vitals on presentation initially noted to have a pulse of 1-26, blood pressure 111/89, respiratory rate of 17.  CT abdomen and pelvis done with mild mucosal thickening and enhancement of the rectum, sigmoid colon and descending colon [place] [address] 22.  No abscess or fistula.  No significant mesenteric inflammation or adenopathy.  Formed stool and fluid stool in the rectum.  Small amount of free fluid right lower quadrant adjacent to small bowel.  Normal appendix.  Small bowel appears normal.  Review of Systems: As per HPI otherwise all other systems reviewed and are  negative.  Past Medical History:  Diagnosis Date   Chlamydia infection    Colitis     Past Surgical History:  Procedure Laterality Date   BIOPSY  11/10/2021   Procedure: BIOPSY;  Surgeon: Willis Modena, MD;  Location: [facility] ENDOSCOPY;  Service: Endoscopy;;   FEMUR CLOSED REDUCTION     FLEXIBLE SIGMOIDOSCOPY N/A 11/10/2021   Procedure: FLEXIBLE SIGMOIDOSCOPY;  Surgeon: Willis Modena, MD;  Location: [facility] ENDOSCOPY;  Service: Endoscopy;  Laterality: N/A;    Social History  reports that she has never smoked. She has never used smokeless tobacco. She reports current alcohol use. She reports current drug use. Drug: Marijuana.  No Known Allergies  History reviewed. No pertinent family history. Mother alive age 55 and healthy.  Father alive age 23 and healthy.  Prior to Admission medications   Medication Sig Start Date End Date Taking? Authorizing Provider  acetaminophen (TYLENOL) 500 MG tablet Take 1,000 mg by mouth every 6 (six) hours as needed for moderate pain or mild pain.    [provider]  ibuprofen (ADVIL) 200 MG tablet Take 400 mg by mouth every 6 (six) hours as needed for moderate pain or mild pain.    [provider]  Norethindrone Acetate-Ethinyl Estrad-FE (LOESTRIN 24 FE) 1-20 MG-MCG(24) tablet Take 1 tablet by mouth daily. 05/10/22   [place] Bing, MD  predniSONE (DELTASONE) 10 MG tablet Take 10 mg by mouth daily with breakfast. Takes 4 a day    [provider]    Physical Exam: Vitals:   10/29/22 1200 10/29/22 1245 10/29/22 1419 10/29/22 1500  BP: 115/68 (!) 116/95 127/86 118/76  Pulse: (!) 105 (!) 103 (!) 106 (!) 104  Resp: 17 17 16 17   Temp:   98.4 F (36.9 C)   TempSrc:   Oral   SpO2: 100% 100% 100% 100%  Weight:      Height:        Constitutional: NAD, calm, comfortable Vitals:   10/29/22 1200 10/29/22 1245 10/29/22 1419 10/29/22 1500  BP: 115/68 (!) 116/95 127/86 118/76  Pulse: (!) 105 (!) 103 (!) 106 (!) 104  Resp: 17 17 16 17    Temp:   98.4 F (36.9 C)   TempSrc:   Oral   SpO2: 100% 100% 100% 100%  Weight:      Height:       Eyes: PERRL, lids and conjunctivae normal ENMT: Mucous membranes are dry. Posterior pharynx clear of any exudate or lesions.Normal dentition.  Neck: normal, supple, no masses, no thyromegaly Respiratory: clear to auscultation bilaterally, no wheezing, no crackles. Normal respiratory effort. No accessory muscle use.  Cardiovascular: Tachycardia, no murmurs / rubs / gallops. No extremity edema. 2+ pedal pulses. No carotid bruits.  Abdomen: no tenderness, no masses palpated. No hepatosplenomegaly. Bowel sounds positive.  Musculoskeletal: no clubbing / cyanosis. No joint deformity upper and lower extremities. Good ROM, no contractures. Normal muscle tone.  Skin: no rashes, lesions, ulcers. No induration Neurologic: CN 2-12 grossly intact. Sensation intact, DTR normal. Strength 5/5 in all 4.  Psychiatric: Normal judgment and insight. Alert and oriented x 3. Normal mood.   Labs on Admission: I have personally reviewed following labs and imaging studies  CBC: Recent Labs  Lab 10/29/22 1013  WBC 15.9*  HGB 5.7*  HCT 23.0*  MCV 64.1*  PLT 668*    Basic Metabolic Panel: Recent Labs  Lab 10/29/22 1013  NA 141  K 3.4*  CL 111  CO2 21*  GLUCOSE 134*  BUN 7  CREATININE 0.70  CALCIUM 8.7*    GFR: Estimated Creatinine Clearance: 94.4 mL/min (by C-G formula based on SCr of 0.7 mg/dL).  Liver Function Tests: Recent Labs  Lab 10/29/22 1013  AST 16  ALT 10  ALKPHOS 58  BILITOT 0.2*  PROT 7.8  ALBUMIN 3.4*    Urine analysis:    Component Value Date/Time   COLORURINE YELLOW 10/29/2022 1013   APPEARANCEUR CLEAR 10/29/2022 1013   LABSPEC >=1.030 10/29/2022 1013   PHURINE 5.5 10/29/2022 1013   GLUCOSEU NEGATIVE 10/29/2022 1013   HGBUR MODERATE (A) 10/29/2022 1013   BILIRUBINUR NEGATIVE 10/29/2022 1013   KETONESUR 15 (A) 10/29/2022 1013   PROTEINUR 30 (A) 10/29/2022 1013    NITRITE NEGATIVE 10/29/2022 1013   LEUKOCYTESUR NEGATIVE 10/29/2022 1013    Radiological Exams on Admission: CT ABDOMEN PELVIS W CONTRAST  Result Date: 10/29/2022 CLINICAL DATA:  Ulcer of colitis, bloody stool. Heart palpitations, short of breath EXAM: CT ABDOMEN AND PELVIS WITH CONTRAST TECHNIQUE: Multidetector CT imaging of the abdomen and pelvis was performed using the standard protocol following bolus administration of intravenous contrast. RADIATION DOSE REDUCTION: This exam was performed according to the departmental dose-optimization program which includes automated exposure control, adjustment of the mA and/or kV according to patient size and/or use of iterative reconstruction technique. CONTRAST:  10/31/2022 OMNIPAQUE IOHEXOL 300 MG/ML  SOLN COMPARISON:  None Available. FINDINGS: Lower chest: Lung bases are clear. Hepatobiliary: No focal hepatic lesion. Gallbladder normal. No biliary duct dilatation. Common bile duct is normal. Pancreas: Pancreas is normal. No ductal dilatation. No pancreatic inflammation. Spleen: Normal spleen Adrenals/urinary tract: Adrenal glands and kidneys  are normal. The ureters and bladder normal. Stomach/Bowel: The stomach, duodenum small-bowel normal. Terminal ileum normal. Appendix normal. There is formed stool in the ascending colon. Beginning in the descending colon there is mild mucosal thickening. There is mucosal enhancement in the distal sigmoid colon and rectum. There is fluid stool and formed stool in the rectum. No evidence of abscess or fistula. Minimal inflammation in the: Mesenteries. Vascular/Lymphatic: Abdominal aorta normal.  No lymphadenopathy Reproductive: Uterus and adnexa unremarkable. Other: Small amount free fluid in the RIGHT lower quadrant adjacent the small bowel (coronal image 55/5) Musculoskeletal: No aggressive osseous lesion. SI joints normal. Internal fixation of the LEFT femoral neck. IMPRESSION: 1. Mild mucosal thickening and enhancement of the  rectum, sigmoid colon and descending colon. Findings similar comparison CT 11/08/2021. No abscess or fistula. No significant mesenteric inflammation or adenopathy. 2. Formed stool and fluid stool in the rectum. 3. Small amount free fluid RIGHT lower quadrant adjacent small bowel. 4. Normal appendix. 5. Small bowel appears normal. Electronically Signed   By: Genevive BiStewart  Edmunds M.D.   On: 10/29/2022 13:08    EKG: Independently reviewed.  Sinus tachycardia with heart rate of 118.  No ischemic changes noted.  Assessment/Plan Principal Problem:   Ulcerative colitis with rectal bleeding (HCC) Active Problems:   Symptomatic anemia   IDA (iron deficiency anemia)   IBD (inflammatory bowel disease)   Hypokalemia   #1 ulcerative colitis with rectal bleeding/flare -Patient noted to have presented with a 2-week history of diarrhea with rectal bleeding similar to initial presentation of ulcerative colitis. -Patient noted to have run out of her prednisone 2 weeks prior to admission and was on prednisone 40 mg daily. -CT abdomen and pelvis done with mild mucosal thickening and enhancement of the rectum, sigmoid colon and descending colon.  No abscess or fistula. -Patient noted to be anemic with hemoglobin of 5.7. 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  HBs antibody - 07/26/2022. -GI recommending checking stool for C. difficile PCR, fecal calprotectin, update of hep B serology in anticipation of initiating biologic therapy in the outpatient setting. -Low residue diet. -Transfused 2 units packed red blood cells ordered and pending with goal hemoglobin > 7.  -Patient started on IV Solu-Medrol 20 mg every 8 hours per GI. -Place on low fiber diet. -IV fluids, supportive care. -Per GI.  2.  Symptomatic  anemia/rectal bleeding/microcytic anemia -Patient presenting generalized weakness, lightheadedness, shortness of breath. -Hemoglobin noted on admission of 5.7. -2 units PRBCs ordered to be transfused per ED and currently pending. -Place on PPI. -Check an anemia panel. -Transfusion threshold hemoglobin < 7.  -GI following  3.  Hypokalemia -Likely secondary to GI losses. -Potassium at 3.4. -Check a magnesium level. -Replete.  4.  Leukocytosis -Patient with a chronic leukocytosis, patient noted to be on long-term steroids since December 2022. -Currently afebrile. -Chest x-ray negative for any acute infiltrate. -Check a UA with cultures and sensitivities. -Follow-  DVT prophylaxis: SCDs Code Status:   Full Family Communication:  Updated patient.  No family at bedside. Disposition Plan:   Patient is from:  Home  Anticipated DC to:  Home  Anticipated DC date:  2 to 3 days  Anticipated DC barriers: Clinical improvement Consults called:  Gastroenterology: Dr. Lorenso QuarryVreeland 10/29/2022 Admission status:  Admit to inpatient/MedSurg  Severity of Illness:  The appropriate patient status for this patient is INPATIENT. Inpatient status is judged to be reasonable and necessary in order to provide the required intensity of service to ensure the patient's safety. The patient's presenting symptoms, physical exam findings, and initial radiographic and laboratory data in the context of their chronic comorbidities is felt to place them at high risk for further clinical deterioration. Furthermore, it is not anticipated that the patient will be medically stable for discharge from the hospital within 2 midnights of admission.   * I certify that at the point of admission it is my clinical judgment that the patient will require inpatient hospital care spanning beyond 2 midnights from the point of admission due to high intensity of service, high risk for further deterioration and high frequency of surveillance  required.*    Ramiro Harvest MD [facility]  How to contact the [facility] Attending or Consulting provider 7A - 7P or covering provider during after hours 7P -7A, for this patient?   Check the care team in [facility] and look for a) attending/consulting [facility] provider listed and b) the [facility] team listed Log into [email or web address] and use [place]'s universal password to access. If you do not have the password, please contact the hospital operator. Locate the [facility] provider you are looking for under [facility] and page to a number that you can be directly reached. If you still have difficulty reaching the provider, please page the [facility] (Director on Call) for the Hospitalists listed on amion for assistance.  10/29/2022, 4:12 PM

## 2022-10-29 NOTE — Consult Note (Signed)
[facility] Gastroenterology Consult  Referring Provider: No ref. provider found Primary Care Physician:  Patient, No Pcp Per Primary Gastroenterologist: Dr. Dulce Sellar [facility] GI)  Reason for Consultation: blood per rectum, history ulcerative colitis   SUBJECTIVE:   HPI: Mackenzie Rios is a 21 y.o. female with past medical history significant for ulcerative colitis. She was diagnosed with UC with flexible sigmoidoscopy on 11/10/2021 completed for hematochezia and diarrhea. Findings at that time were significant for moderated congested, erythematous, eroded, ulcerated and decreased vascular pattern in the rectum, recto-sigmoid, sigmoid and descending colon. Biopsies showed chronic active colitis with no granulomas or dysplasia. She sees Dr. Dulce Sellar in the outpatient setting, was last seen in office in January 2023, at that time arrangements were being made for biologic therapy. She has not yet completed all prerequisite testing for biologic therapy. She has been on prednisone 40 mg PO daily since January 2023, though she ran out of medication roughly 2 weeks prior. When on steroid therapy she has no blood per rectum. Over the past 2 weeks, she has been experiencing 2-3 stools per day with blood. She has some abdominal discomfort, though very mild in nature. No chest pain. Has been short of breath and with significant fatigue, symptoms similar to when she had low hemoglobin in the past. She does not believe she has lost weight during this time. She has no disease related surgeries. 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 No NSAID use. No family history colon cancer nor inflammatory bowel disease.  Past Medical History:  Diagnosis Date   Chlamydia infection    Colitis    Past Surgical History:  Procedure Laterality Date   BIOPSY  11/10/2021   Procedure: BIOPSY;  Surgeon: Willis Modena, MD;  Location: [facility];  Service: Endoscopy;;   FEMUR CLOSED REDUCTION     FLEXIBLE SIGMOIDOSCOPY N/A 11/10/2021    Procedure: FLEXIBLE SIGMOIDOSCOPY;  Surgeon: Willis Modena, MD;  Location: [facility];  Service: Endoscopy;  Laterality: N/A;   Prior to Admission medications   Medication Sig Start Date End Date Taking? Authorizing Provider  acetaminophen (TYLENOL) 500 MG tablet Take 1,000 mg by mouth every 6 (six) hours as needed for moderate pain or mild pain.    [provider]  ibuprofen (ADVIL) 200 MG tablet Take 400 mg by mouth every 6 (six) hours as needed for moderate pain or mild pain.    [provider]  Norethindrone Acetate-Ethinyl Estrad-FE (LOESTRIN 24 FE) 1-20 MG-MCG(24) tablet Take 1 tablet by mouth daily. 05/10/22   [place] Bing, MD  predniSONE (DELTASONE) 10 MG tablet Take 10 mg by mouth daily with breakfast. Takes 4 a day    [provider]   Current Facility-Administered Medications  Medication Dose Route Frequency Provider Last Rate Last Admin   0.9 %  sodium chloride infusion  10 mL/hr Intravenous Once Trudee Grip A, PA-C       methylPREDNISolone sodium succinate (SOLU-MEDROL) 40 mg/mL injection 20 mg  20 mg Intravenous Q8H Alvira Monday, MD       Current Outpatient Medications  Medication Sig Dispense Refill   acetaminophen (TYLENOL) 500 MG tablet Take 1,000 mg by mouth every 6 (six) hours as needed for moderate pain or mild pain.     ibuprofen (ADVIL) 200 MG tablet Take 400 mg by mouth every 6 (six) hours as needed for moderate pain or mild pain.     Norethindrone Acetate-Ethinyl Estrad-FE (LOESTRIN 24 FE) 1-20 MG-MCG(24) tablet Take 1 tablet by mouth daily.  90 tablet 3   predniSONE (DELTASONE) 10 MG tablet Take 10 mg by mouth daily with breakfast. Takes 4 a day     Allergies as of 10/29/2022   (No Known Allergies)   History reviewed. No pertinent family history. Social History   Socioeconomic History   Marital status: Single    Spouse name: Not on file   Number of children: Not on file   Years of education: Not on file   Highest education  level: Not on file  Occupational History   Not on file  Tobacco Use   Smoking status: Never   Smokeless tobacco: Never  Vaping Use   Vaping Use: Never used  Substance and Sexual Activity   Alcohol use: Yes    Comment: occassional   Drug use: Yes    Types: Marijuana   Sexual activity: Yes    Birth control/protection: None  Other Topics Concern   Not on file  Social History Narrative   Not on file   Social Determinants of Health   Financial Resource Strain: Not on file  Food Insecurity: Not on file  Transportation Needs: Not on file  Physical Activity: Not on file  Stress: Not on file  Social Connections: Not on file  Intimate Partner Violence: Not on file   Review of Systems:  Review of Systems  Constitutional:  Positive for malaise/fatigue. Negative for weight loss.  Respiratory:  Positive for shortness of breath.   Cardiovascular:  Negative for chest pain.  Gastrointestinal:  Positive for abdominal pain, blood in stool and diarrhea.    OBJECTIVE:   Temp:  [97.8 F (36.6 C)-98.4 F (36.9 C)] 98.4 F (36.9 C) (11/27 1419) Pulse Rate:  [98-126] 106 (11/27 1419) Resp:  [16-18] 16 (11/27 1419) BP: (111-130)/(68-95) 127/86 (11/27 1419) SpO2:  [100 %] 100 % (11/27 1419) Weight:  [63.5 kg] 63.5 kg (11/27 1009)   Physical Exam Constitutional:      General: She is not in acute distress.    Appearance: She is not ill-appearing, toxic-appearing or diaphoretic.  Cardiovascular:     Rate and Rhythm: Normal rate and regular rhythm.  Pulmonary:     Effort: No respiratory distress.     Breath sounds: Normal breath sounds.  Abdominal:     General: Bowel sounds are normal. There is no distension.     Palpations: Abdomen is soft.     Tenderness: There is no abdominal tenderness. There is no guarding.  Musculoskeletal:     Right lower leg: No edema.     Left lower leg: No edema.  Skin:    General: Skin is warm and dry.  Neurological:     Mental Status: She is alert.      Labs: Recent Labs    10/29/22 1013  WBC 15.9*  HGB 5.7*  HCT 23.0*  PLT 668*   BMET Recent Labs    10/29/22 1013  NA 141  K 3.4*  CL 111  CO2 21*  GLUCOSE 134*  BUN 7  CREATININE 0.70  CALCIUM 8.7*   LFT Recent Labs    10/29/22 1013  PROT 7.8  ALBUMIN 3.4*  AST 16  ALT 10  ALKPHOS 58  BILITOT 0.2*   PT/INR No results for input(s): "LABPROT", "INR" in the last 72 hours.  Diagnostic imaging: CT ABDOMEN PELVIS W CONTRAST  Result Date: 10/29/2022 CLINICAL DATA:  Ulcer of colitis, bloody stool. Heart palpitations, short of breath EXAM: CT ABDOMEN AND PELVIS WITH CONTRAST TECHNIQUE: Multidetector CT imaging  of the abdomen and pelvis was performed using the standard protocol following bolus administration of intravenous contrast. RADIATION DOSE REDUCTION: This exam was performed according to the departmental dose-optimization program which includes automated exposure control, adjustment of the mA and/or kV according to patient size and/or use of iterative reconstruction technique. CONTRAST:  OMNIPAQUE IOHEXOL 300 MG/ML  SOLN COMPARISON:  None Available. FINDINGS: Lower chest: Lung bases are clear. Hepatobiliary: No focal hepatic lesion. Gallbladder normal. No biliary duct dilatation. Common bile duct is normal. Pancreas: Pancreas is normal. No ductal dilatation. No pancreatic inflammation. Spleen: Normal spleen Adrenals/urinary tract: Adrenal glands and kidneys are normal. The ureters and bladder normal. Stomach/Bowel: The stomach, duodenum small-bowel normal. Terminal ileum normal. Appendix normal. There is formed stool in the ascending colon. Beginning in the descending colon there is mild mucosal thickening. There is mucosal enhancement in the distal sigmoid colon and rectum. There is fluid stool and formed stool in the rectum. No evidence of abscess or fistula. Minimal inflammation in the: Mesenteries. Vascular/Lymphatic: Abdominal aorta normal.  No lymphadenopathy  Reproductive: Uterus and adnexa unremarkable. Other: Small amount free fluid in the RIGHT lower quadrant adjacent the small bowel (coronal image 55/5) Musculoskeletal: No aggressive osseous lesion. SI joints normal. Internal fixation of the LEFT femoral neck. IMPRESSION: 1. Mild mucosal thickening and enhancement of the rectum, sigmoid colon and descending colon. Findings similar comparison CT 11/08/2021. No abscess or fistula. No significant mesenteric inflammation or adenopathy. 2. Formed stool and fluid stool in the rectum. 3. Small amount free fluid RIGHT lower quadrant adjacent small bowel. 4. Normal appendix. 5. Small bowel appears normal. Electronically Signed   By: Genevive Bi M.D.   On: 10/29/2022 13:08    IMPRESSION: Ulcerative colitis, not in remission   -Diagnosed 11/2021 via flexible sigmoidoscopy -Descending, sigmoid, rectum in distribution based on most recent endoscopy and most recent imaging  -No prior colonoscopy  -Biologic naive, has been on long-term steroid therapy  -Quantiferon TB gold negative on 08/03/22, has had indeterminate TB gold testing on 07/26/22  -CXR completed 10/18/2022 given indeterminate TB gold testing, showed no active cardiopulmonary disease  -HBsAb negative 07/26/22  -No known extracolonic manifestations  -No disease related surgeries  -No family history colon cancer nor inflammatory bowel disease Microcytic anemia Leukocytosis Hypokalemia Hypoalbuminemia  PLAN: -Check C.diff, stool PCR, fecal calprotectin -Update hepatitis B serology in anticipation of initiating biologic therapy in outpatient setting -Start Solumedrol 20 mg IV Q8Hr -Transfuse for Hgb < 7, trend H/H -Ok for low residue/low fiber diet from GI standpoint -[facility] will follow   LOS: 0 days   Liliane Shi, DO [facility] Gastroenterology

## 2022-10-29 NOTE — ED Provider Notes (Cosign Needed Addendum)
21 year old female presents to the ER as a transfer from [facility] hospital, history of ulcerative colitis and has been having a flare.  Was taking steroids.  Has been noticing a large amount of stool in the last several days.  Dizzy with the slightest movement.  History of this in the past and has required a blood transfusion.  Followed by Dr. Dulce Sellar with [facility].  Transferred here for blood transfusion and admission per discussion with Dr. Dulce Sellar.  Reviewed workup, hemoglobin of 5.7 at [facility] with mild hypokalemia 3.4.  CT of the abdomen as below, agree with radiology read  IMPRESSION:  1. Mild mucosal thickening and enhancement of the rectum, sigmoid  colon and descending colon. Findings similar comparison CT  11/08/2021. No abscess or fistula. No significant mesenteric  inflammation or adenopathy.  2. Formed stool and fluid stool in the rectum.  3. Small amount free fluid RIGHT lower quadrant adjacent small  bowel.  4. Normal appendix.  5. Small bowel appears normal.     2 units of blood ordered.  Secure chat sent to Dr. Dulce Sellar.  Plan for admission.  Results for orders placed or performed during the hospital encounter of 10/29/22  Lipase, blood  Result Value Ref Range   Lipase 25 11 - 51 U/L  Comprehensive metabolic panel  Result Value Ref Range   Sodium 141 135 - 145 mmol/L   Potassium 3.4 (L) 3.5 - 5.1 mmol/L   Chloride 111 98 - 111 mmol/L   CO2 21 (L) 22 - 32 mmol/L   Glucose, Bld 134 (H) 70 - 99 mg/dL   BUN 7 6 - 20 mg/dL   Creatinine, Ser 3.53 0.44 - 1.00 mg/dL   Calcium 8.7 (L) 8.9 - 10.3 mg/dL   Total Protein 7.8 6.5 - 8.1 g/dL   Albumin 3.4 (L) 3.5 - 5.0 g/dL   AST 16 15 - 41 U/L   ALT 10 0 - 44 U/L   Alkaline Phosphatase 58 38 - 126 U/L   Total Bilirubin 0.2 (L) 0.3 - 1.2 mg/dL   GFR, Estimated >61 >44 mL/min   Anion gap 9 5 - 15  CBC  Result Value Ref Range   WBC 15.9 (H) 4.0 - 10.5 K/uL   RBC 3.59 (L) 3.87 - 5.11 MIL/uL   Hemoglobin 5.7 (LL)  12.0 - 15.0 g/dL   HCT 31.5 (L) 40.0 - 86.7 %   MCV 64.1 (L) 80.0 - 100.0 fL   MCH 15.9 (L) 26.0 - 34.0 pg   MCHC 24.8 (L) 30.0 - 36.0 g/dL   RDW 61.9 (H) 50.9 - 32.6 %   Platelets 668 (H) 150 - 400 K/uL   nRBC 0.0 0.0 - 0.2 %  Urinalysis, Routine w reflex microscopic Urine, Clean Catch  Result Value Ref Range   Color, Urine YELLOW YELLOW   APPearance CLEAR CLEAR   Specific Gravity, Urine >=1.030 1.005 - 1.030   pH 5.5 5.0 - 8.0   Glucose, UA NEGATIVE NEGATIVE mg/dL   Hgb urine dipstick MODERATE (A) NEGATIVE   Bilirubin Urine NEGATIVE NEGATIVE   Ketones, ur 15 (A) NEGATIVE mg/dL   Protein, ur 30 (A) NEGATIVE mg/dL   Nitrite NEGATIVE NEGATIVE   Leukocytes,Ua NEGATIVE NEGATIVE  Pregnancy, urine  Result Value Ref Range   Preg Test, Ur NEGATIVE NEGATIVE  Urinalysis, Microscopic (reflex)  Result Value Ref Range   RBC / HPF 0-5 0 - 5 RBC/hpf   WBC, UA 0-5 0 - 5 WBC/hpf  Bacteria, UA MANY (A) NONE SEEN   Squamous Epithelial / LPF 11-20 0 - 5   Mucus PRESENT    CT ABDOMEN PELVIS W CONTRAST  Result Date: 10/29/2022 CLINICAL DATA:  Ulcer of colitis, bloody stool. Heart palpitations, short of breath EXAM: CT ABDOMEN AND PELVIS WITH CONTRAST TECHNIQUE: Multidetector CT imaging of the abdomen and pelvis was performed using the standard protocol following bolus administration of intravenous contrast. RADIATION DOSE REDUCTION: This exam was performed according to the departmental dose-optimization program which includes automated exposure control, adjustment of the mA and/or kV according to patient size and/or use of iterative reconstruction technique. CONTRAST:  OMNIPAQUE IOHEXOL 300 MG/ML  SOLN COMPARISON:  None Available. FINDINGS: Lower chest: Lung bases are clear. Hepatobiliary: No focal hepatic lesion. Gallbladder normal. No biliary duct dilatation. Common bile duct is normal. Pancreas: Pancreas is normal. No ductal dilatation. No pancreatic inflammation. Spleen: Normal spleen  Adrenals/urinary tract: Adrenal glands and kidneys are normal. The ureters and bladder normal. Stomach/Bowel: The stomach, duodenum small-bowel normal. Terminal ileum normal. Appendix normal. There is formed stool in the ascending colon. Beginning in the descending colon there is mild mucosal thickening. There is mucosal enhancement in the distal sigmoid colon and rectum. There is fluid stool and formed stool in the rectum. No evidence of abscess or fistula. Minimal inflammation in the: Mesenteries. Vascular/Lymphatic: Abdominal aorta normal.  No lymphadenopathy Reproductive: Uterus and adnexa unremarkable. Other: Small amount free fluid in the RIGHT lower quadrant adjacent the small bowel (coronal image 55/5) Musculoskeletal: No aggressive osseous lesion. SI joints normal. Internal fixation of the LEFT femoral neck. IMPRESSION: 1. Mild mucosal thickening and enhancement of the rectum, sigmoid colon and descending colon. Findings similar comparison CT 11/08/2021. No abscess or fistula. No significant mesenteric inflammation or adenopathy. 2. Formed stool and fluid stool in the rectum. 3. Small amount free fluid RIGHT lower quadrant adjacent small bowel. 4. Normal appendix. 5. Small bowel appears normal. Electronically Signed   By: Genevive Bi M.D.   On: 10/29/2022 13:08   DG Chest 2 View  Result Date: 10/18/2022 CLINICAL DATA:  Positive TB test EXAM: CHEST - 2 VIEW COMPARISON:  11/11/2021 FINDINGS: The heart size and mediastinal contours are within normal limits. Both lungs are clear. The visualized skeletal structures are unremarkable. IMPRESSION: No active cardiopulmonary disease. Electronically Signed   By: Judie Petit.  Shick M.D.   On: 10/18/2022 16:20          Mare Ferrari, PA-C 10/29/22 1510    Glyn Ade, MD 10/31/22 1115

## 2022-10-30 DIAGNOSIS — D649 Anemia, unspecified: Secondary | ICD-10-CM | POA: Diagnosis not present

## 2022-10-30 DIAGNOSIS — K51911 Ulcerative colitis, unspecified with rectal bleeding: Secondary | ICD-10-CM | POA: Diagnosis not present

## 2022-10-30 DIAGNOSIS — D72829 Elevated white blood cell count, unspecified: Secondary | ICD-10-CM | POA: Diagnosis not present

## 2022-10-30 DIAGNOSIS — E876 Hypokalemia: Secondary | ICD-10-CM | POA: Diagnosis not present

## 2022-10-30 LAB — COMPREHENSIVE METABOLIC PANEL
ALT: 9 U/L (ref 0–44)
AST: 11 U/L — ABNORMAL LOW (ref 15–41)
Albumin: 3.5 g/dL (ref 3.5–5.0)
Alkaline Phosphatase: 59 U/L (ref 38–126)
Anion gap: 9 (ref 5–15)
BUN: 8 mg/dL (ref 6–20)
CO2: 22 mmol/L (ref 22–32)
Calcium: 8.7 mg/dL — ABNORMAL LOW (ref 8.9–10.3)
Chloride: 110 mmol/L (ref 98–111)
Creatinine, Ser: 0.62 mg/dL (ref 0.44–1.00)
GFR, Estimated: >60 mL/min (ref >60)
Glucose, Bld: 120 mg/dL — ABNORMAL HIGH (ref 70–99)
Potassium: 4.4 mmol/L (ref 3.5–5.1)
Sodium: 141 mmol/L (ref 135–145)
Total Bilirubin: 0.7 mg/dL (ref 0.3–1.2)
Total Protein: 7.8 g/dL (ref 6.5–8.1)

## 2022-10-30 LAB — CBC WITH DIFFERENTIAL/PLATELET
Abs Immature Granulocytes: 0.06 10*3/uL (ref 0.00–0.07)
Basophils Absolute: 0 10*3/uL (ref 0.0–0.1)
Basophils Relative: 0 %
Eosinophils Absolute: 0 10*3/uL (ref 0.0–0.5)
Eosinophils Relative: 0 %
HCT: 31.9 % — ABNORMAL LOW (ref 36.0–46.0)
Hemoglobin: 9 g/dL — ABNORMAL LOW (ref 12.0–15.0)
Immature Granulocytes: 0 %
Lymphocytes Relative: 5 %
Lymphs Abs: 0.8 10*3/uL (ref 0.7–4.0)
MCH: 20.1 pg — ABNORMAL LOW (ref 26.0–34.0)
MCHC: 28.2 g/dL — ABNORMAL LOW (ref 30.0–36.0)
MCV: 71.2 fL — ABNORMAL LOW (ref 80.0–100.0)
Monocytes Absolute: 0.9 10*3/uL (ref 0.1–1.0)
Monocytes Relative: 6 %
Neutro Abs: 13.9 10*3/uL — ABNORMAL HIGH (ref 1.7–7.7)
Neutrophils Relative %: 89 %
Platelets: 607 10*3/uL — ABNORMAL HIGH (ref 150–400)
RBC: 4.48 MIL/uL (ref 3.87–5.11)
RDW: 25.2 % — ABNORMAL HIGH (ref 11.5–15.5)
WBC: 15.6 10*3/uL — ABNORMAL HIGH (ref 4.0–10.5)
nRBC: 0 % (ref 0.0–0.2)

## 2022-10-30 LAB — GASTROINTESTINAL PANEL BY PCR, STOOL (REPLACES STOOL CULTURE)

## 2022-10-30 LAB — BPAM RBC
Blood Product Expiration Date: 202312252359
Blood Product Expiration Date: 202312252359
ISSUE DATE / TIME: 202311271738
ISSUE DATE / TIME: 202311272220
Unit Type and Rh: 5100
Unit Type and Rh: 5100

## 2022-10-30 LAB — TYPE AND SCREEN
ABO/RH(D): O POS
Antibody Screen: NEGATIVE
Unit division: 0
Unit division: 0

## 2022-10-30 LAB — HEPATITIS B CORE ANTIBODY, IGM: Hep B C IgM: NONREACTIVE

## 2022-10-30 LAB — VITAMIN B12: Vitamin B-12: 409 pg/mL (ref 180–914)

## 2022-10-30 LAB — IRON AND TIBC
Iron: 18 ug/dL — ABNORMAL LOW (ref 28–170)
Saturation Ratios: 4 % — ABNORMAL LOW (ref 10.4–31.8)
TIBC: 406 ug/dL (ref 250–450)
UIBC: 388 ug/dL

## 2022-10-30 LAB — FERRITIN: Ferritin: 3 ng/mL — ABNORMAL LOW (ref 11–307)

## 2022-10-30 LAB — FOLATE: Folate: 13 ng/mL (ref >5.9)

## 2022-10-30 LAB — HEPATITIS B SURFACE ANTIGEN: Hepatitis B Surface Ag: NONREACTIVE

## 2022-10-30 MED ORDER — SODIUM CHLORIDE 0.9 % IV SOLN
510.0000 mg | Freq: Once | INTRAVENOUS | Status: AC
  Administered 2022-10-30: 510 mg via INTRAVENOUS
  Filled 2022-10-30: qty 17

## 2022-10-30 NOTE — Progress Notes (Signed)
PROGRESS NOTE    Mackenzie MarlinKyra G Rios  RUE:[id number]:[id number] DOB: 2001-03-31 DOA: 10/29/2022 PCP: Patient, No Pcp Per   Chief Complaint  Patient presents with   Rectal Bleeding    Brief Narrative:  Patient 21 year old female history of ulcerative colitis diagnosed flex sig 11/10/2021, noted to have been on prednisone since December 2022 ran out 2 weeks prior to admission presented to freestanding ED with a 2-week history of bloody diarrhea with 3-5 stools per day.  CT abdomen and pelvis done concerning for ulcerative colitis flare.  Patient seen by GI, placed on IV steroids.  Patient also noted to have a severe iron deficiency anemia with a hemoglobin of 5.7 and transfused 2 units packed red blood cells.   Assessment & Plan:   Principal Problem:   Ulcerative colitis with rectal bleeding (HCC) Active Problems:   Symptomatic anemia   IDA (iron deficiency anemia)   IBD (inflammatory bowel disease)   Hypokalemia   Leukocytosis  #1 ulcerative colitis with rectal bleeding/flare -Patient noted to have presented with a 2-week history of diarrhea with rectal bleeding similar to initial presentation of ulcerative colitis. -Patient noted to have run out of her prednisone 2 weeks prior to admission and was on prednisone 40 mg daily. -CT abdomen and pelvis done with mild mucosal thickening and enhancement of the rectum, sigmoid colon and descending colon.  No abscess or fistula. -Patient noted to be anemic with hemoglobin of 5.7 on presentation. 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  HBs antibody - 07/26/2022. -C. difficile PCR negative.  GI pathogen panel negative.  -Fecal calprotectin pending. -Hep B surface antigen nonreactive, hepatitis B core antibody IgM nonreactive. -Status posttransfusion 2  units packed red blood cells with hemoglobin currently at 9.0 from 5.7 on admission. -Continue IV Solu-Medrol 20 mg every 8 hours as recommended per GI. -Low fiber diet. -Saline lock IV fluids. -GI following and anticipating possible transition to oral steroids tomorrow. -Appreciate GI input and recommendations.   2.  Symptomatic anemia/rectal bleeding/microcytic severe iron deficiency anemia -Patient presented with generalized weakness, lightheadedness, shortness of breath. -Hemoglobin noted on admission of 5.7. - status post transfusion 2 units packed red blood cells hemoglobin currently at 9.0 today.   -Anemia panel consistent with severe iron deficiency anemia.  -Will give a dose of IV iron.  -Patient states unable to tolerate oral iron. -Follow H&H.  -Transfusion threshold hemoglobin < 7.  -GI following   3.  Hypokalemia -Likely secondary to GI losses. -Repleted.  Potassium at 4.4 today.   -Magnesium at 2.0.   -Repeat labs in the AM.    4.  Leukocytosis -Patient with a chronic leukocytosis, patient noted to be on long-term steroids since December 2022. -Remained afebrile.   -Chest x-ray done with no acute abnormalities.   -Urinalysis done nitrite negative, leukocytes negative.   -C. difficile PCR negative. -GI pathogen panel negative. -No need for antibiotics at this time.   -Follow.     DVT prophylaxis: SCDs Code Status: Full Family Communication: Updated patient.  No family at bedside. Disposition: Home once cleared by gastroenterology hopefully in the next 1 to 2 days.  Status is: Inpatient Remains inpatient appropriate because: Severity of illness.  Currently on IV steroids.   Consultants:  Gastroenterology: Dr. Lorenso QuarryVreeland 10/29/2022  Procedures:  CT abdomen and pelvis  10/29/2022 Transfusion 2 units packed red blood cells 10/29/2022  Antimicrobials:  None   Subjective: Patient sitting up in bed.  Overall feeling much better than she did on admission.   Denies any further shortness of breath.  No chest pain.  No lightheadedness.  No fatigue.  Still endorses bloody bowel movements.  Objective: Vitals:   10/30/22 0124 10/30/22 0500 10/30/22 0619 10/30/22 0834  BP: 119/85  111/79 122/86  Pulse: 78  82 (!) 101  Resp: 16  16 16   Temp: 98.3 F (36.8 C)  98.7 F (37.1 C) 98.1 F (36.7 C)  TempSrc: Oral  Oral Oral  SpO2: 100%  100% 100%  Weight:  62.5 kg    Height:        Intake/Output Summary (Last 24 hours) at 10/30/2022 0949 Last data filed at 10/30/2022 0600 Gross per 24 hour  Intake 864.67 ml  Output 201 ml  Net 663.67 ml   Filed Weights   10/29/22 1009 10/30/22 0500  Weight: 63.5 kg 62.5 kg    Examination:  General exam: Appears calm and comfortable  Respiratory system: Clear to auscultation. Respiratory effort normal. Cardiovascular system: S1 & S2 heard, RRR. No JVD, murmurs, rubs, gallops or clicks. No pedal edema. Gastrointestinal system: Abdomen is nondistended, soft and nontender. No organomegaly or masses felt. Normal bowel sounds heard. Central nervous system: Alert and oriented. No focal neurological deficits. Extremities: Symmetric 5 x 5 power. Skin: No rashes, lesions or ulcers Psychiatry: Judgement and insight appear normal. Mood & affect appropriate.     Data Reviewed: I have personally reviewed following labs and imaging studies  CBC: Recent Labs  Lab 10/29/22 1013 10/30/22 0427  WBC 15.9* 15.6*  NEUTROABS  --  13.9*  HGB 5.7* 9.0*  HCT 23.0* 31.9*  MCV 64.1* 71.2*  PLT 668* 607*    Basic Metabolic Panel: Recent Labs  Lab 10/29/22 1013 10/29/22 2201 10/30/22 0427  NA 141  --  141  K 3.4*  --  4.4  CL 111  --  110  CO2 21*  --  22  GLUCOSE 134*  --  120*  BUN 7  --  8  CREATININE 0.70  --  0.62  CALCIUM 8.7*  --  8.7*  MG  --  2.0  --   PHOS  --  3.3  --     GFR: Estimated Creatinine Clearance: 94.4 mL/min (by C-G formula based on SCr of 0.62 mg/dL).  Liver Function  Tests: Recent Labs  Lab 10/29/22 1013 10/30/22 0427  AST 16 11*  ALT 10 9  ALKPHOS 58 59  BILITOT 0.2* 0.7  PROT 7.8 7.8  ALBUMIN 3.4* 3.5    CBG: No results for input(s): "GLUCAP" in the last 168 hours.   Recent Results (from the past 240 hour(s))  Gastrointestinal Panel by PCR , Stool     Status: None   Collection Time: 10/29/22  1:22 PM   Specimen: STOOL  Result Value Ref Range Status   Campylobacter species NOT DETECTED NOT DETECTED Final   Plesimonas shigelloides NOT DETECTED NOT DETECTED Final   Salmonella species NOT DETECTED NOT DETECTED Final   Yersinia enterocolitica NOT DETECTED NOT DETECTED Final   Vibrio species NOT DETECTED NOT DETECTED Final   Vibrio cholerae NOT DETECTED NOT DETECTED Final   Enteroaggregative E coli (EAEC) NOT DETECTED NOT DETECTED Final   Enteropathogenic E coli (EPEC) NOT DETECTED NOT DETECTED Final   Enterotoxigenic E coli (ETEC) NOT DETECTED NOT DETECTED Final  Shiga like toxin producing E coli (STEC) NOT DETECTED NOT DETECTED Final   Shigella/Enteroinvasive E coli (EIEC) NOT DETECTED NOT DETECTED Final   Cryptosporidium NOT DETECTED NOT DETECTED Final   Cyclospora cayetanensis NOT DETECTED NOT DETECTED Final   Entamoeba histolytica NOT DETECTED NOT DETECTED Final   Giardia lamblia NOT DETECTED NOT DETECTED Final   Adenovirus F40/41 NOT DETECTED NOT DETECTED Final   Astrovirus NOT DETECTED NOT DETECTED Final   Norovirus GI/GII NOT DETECTED NOT DETECTED Final   Rotavirus A NOT DETECTED NOT DETECTED Final   Sapovirus (I, II, IV, and V) NOT DETECTED NOT DETECTED Final    Comment: Performed at [facility], [address] [place], [place] [address]  C Difficile Quick Screen w PCR reflex     Status: None   Collection Time: 10/29/22  1:23 PM   Specimen: STOOL  Result Value Ref Range Status   C Diff antigen NEGATIVE NEGATIVE Final   C Diff toxin NEGATIVE NEGATIVE Final   C Diff interpretation No C. difficile detected.  Final     Comment: Performed at [facility], [address], [place], [place] [address]         Radiology Studies: CT ABDOMEN PELVIS W CONTRAST  Result Date: 10/29/2022 CLINICAL DATA:  Ulcer of colitis, bloody stool. Heart palpitations, short of breath EXAM: CT ABDOMEN AND PELVIS WITH CONTRAST TECHNIQUE: Multidetector CT imaging of the abdomen and pelvis was performed using the standard protocol following bolus administration of intravenous contrast. RADIATION DOSE REDUCTION: This exam was performed according to the departmental dose-optimization program which includes automated exposure control, adjustment of the mA and/or kV according to patient size and/or use of iterative reconstruction technique. CONTRAST:  OMNIPAQUE IOHEXOL 300 MG/ML  SOLN COMPARISON:  None Available. FINDINGS: Lower chest: Lung bases are clear. Hepatobiliary: No focal hepatic lesion. Gallbladder normal. No biliary duct dilatation. Common bile duct is normal. Pancreas: Pancreas is normal. No ductal dilatation. No pancreatic inflammation. Spleen: Normal spleen Adrenals/urinary tract: Adrenal glands and kidneys are normal. The ureters and bladder normal. Stomach/Bowel: The stomach, duodenum small-bowel normal. Terminal ileum normal. Appendix normal. There is formed stool in the ascending colon. Beginning in the descending colon there is mild mucosal thickening. There is mucosal enhancement in the distal sigmoid colon and rectum. There is fluid stool and formed stool in the rectum. No evidence of abscess or fistula. Minimal inflammation in the: Mesenteries. Vascular/Lymphatic: Abdominal aorta normal.  No lymphadenopathy Reproductive: Uterus and adnexa unremarkable. Other: Small amount free fluid in the RIGHT lower quadrant adjacent the small bowel (coronal image 55/5) Musculoskeletal: No aggressive osseous lesion. SI joints normal. Internal fixation of the LEFT femoral neck. IMPRESSION: 1. Mild mucosal thickening  and enhancement of the rectum, sigmoid colon and descending colon. Findings similar comparison CT 11/08/2021. No abscess or fistula. No significant mesenteric inflammation or adenopathy. 2. Formed stool and fluid stool in the rectum. 3. Small amount free fluid RIGHT lower quadrant adjacent small bowel. 4. Normal appendix. 5. Small bowel appears normal. Electronically Signed   By: Genevive Bi M.D.   On: 10/29/2022 13:08        Scheduled Meds:  acetaminophen  650 mg Oral Once   methylPREDNISolone (SOLU-MEDROL) injection  20 mg Intravenous Q8H   sodium chloride flush  3 mL Intravenous Q12H   Continuous Infusions:  sodium chloride       LOS: 1 day    Time spent: 40 minutes    Ramiro Harvest, MD [facility]  To contact the attending provider between 7A-7P or the covering provider during after hours 7P-7A, please log into the web site [email or web address] and access using universal [place] password for that web site. If you do not have the password, please call the hospital operator.  10/30/2022, 9:49 AM

## 2022-10-30 NOTE — Progress Notes (Signed)
  Transition of Care (TOC) Screening Note   Patient Details  Name: Mackenzie Rios Date of Birth: 24-Jun-2001   Transition of Care [facility]) CM/SW Contact:    Amada Jupiter, LCSW Phone Number: 10/30/2022, 2:12 PM    Transition of Care Department [facility]) has reviewed patient and no TOC needs have been identified at this time. We will continue to monitor patient advancement through interdisciplinary progression rounds. If new patient transition needs arise, please place a TOC consult.

## 2022-10-30 NOTE — Progress Notes (Signed)
[facility] Progress Note  SUBJECTIVE:   Interval history: Mackenzie Rios was seen and evaluated today at bedside. Resting comfortably in bed, denied abdominal pain. No nausea or vomiting. Still have some blood in stool. Stool is becoming more formed. Denied chest pain or shortness of breath.   Past Medical History:  Diagnosis Date   Chlamydia infection    Colitis    Past Surgical History:  Procedure Laterality Date   BIOPSY  11/10/2021   Procedure: BIOPSY;  Surgeon: Willis Modena, MD;  Location: [facility];  Service: Endoscopy;;   FEMUR CLOSED REDUCTION     FLEXIBLE SIGMOIDOSCOPY N/A 11/10/2021   Procedure: FLEXIBLE SIGMOIDOSCOPY;  Surgeon: Willis Modena, MD;  Location: [facility];  Service: Endoscopy;  Laterality: N/A;   Current Facility-Administered Medications  Medication Dose Route Frequency Provider Last Rate Last Admin   0.9 %  sodium chloride infusion   Intravenous Continuous Rodolph Bong, MD       acetaminophen (TYLENOL) tablet 650 mg  650 mg Oral Q6H PRN Rodolph Bong, MD   650 mg at 10/29/22 1630   Or   acetaminophen (TYLENOL) suppository 650 mg  650 mg Rectal Q6H PRN Rodolph Bong, MD       acetaminophen (TYLENOL) tablet 650 mg  650 mg Oral Once Rodolph Bong, MD       magnesium citrate solution 1 Bottle  1 Bottle Oral Once PRN Rodolph Bong, MD       methylPREDNISolone sodium succinate (SOLU-MEDROL) 40 mg/mL injection 20 mg  20 mg Intravenous Q8H Rodolph Bong, MD   20 mg at 10/30/22 0535   metoprolol tartrate (LOPRESSOR) injection 5 mg  5 mg Intravenous Q6H PRN Rodolph Bong, MD       morphine (PF) 2 MG/ML injection 1 mg  1 mg Intravenous Q4H PRN Rodolph Bong, MD       ondansetron [facility]) tablet 4 mg  4 mg Oral Q6H PRN Rodolph Bong, MD       Or   ondansetron [facility]) injection 4 mg  4 mg Intravenous Q6H PRN Rodolph Bong, MD       oxyCODONE (Oxy IR/ROXICODONE) immediate release tablet 5 mg  5 mg Oral  Q4H PRN Rodolph Bong, MD       senna-docusate (Senokot-S) tablet 1 tablet  1 tablet Oral QHS PRN Rodolph Bong, MD       sodium chloride flush (NS) 0.9 % injection 3 mL  3 mL Intravenous Q12H Rodolph Bong, MD   3 mL at 10/29/22 2230   sorbitol 70 % solution 30 mL  30 mL Oral Daily PRN Rodolph Bong, MD       Allergies as of 10/29/2022   (No Known Allergies)   Review of Systems:  Review of Systems  Respiratory:  Negative for shortness of breath.   Cardiovascular:  Negative for chest pain.  Gastrointestinal:  Positive for blood in stool and diarrhea. Negative for abdominal pain, nausea and vomiting.    OBJECTIVE:   Temp:  [97.8 F (36.6 C)-98.7 F (37.1 C)] 98.1 F (36.7 C) (11/28 0834) Pulse Rate:  [71-126] 101 (11/28 0834) Resp:  [15-17] 16 (11/28 0834) BP: (111-140)/(68-95) 122/86 (11/28 0834) SpO2:  [99 %-100 %] 100 % (11/28 0834) Weight:  [62.5 kg] 62.5 kg (11/28 0500) Last BM Date : 10/29/22 Physical Exam Constitutional:      General: She is not in acute distress.    Appearance: She is  not ill-appearing, toxic-appearing or diaphoretic.  Cardiovascular:     Rate and Rhythm: Normal rate and regular rhythm.     Comments: Few ectopic beats appreciated. Pulmonary:     Effort: No respiratory distress.     Breath sounds: Normal breath sounds.  Abdominal:     General: Bowel sounds are normal. There is no distension.     Palpations: Abdomen is soft.     Tenderness: There is no abdominal tenderness.  Skin:    General: Skin is warm and dry.  Neurological:     Mental Status: She is alert.     Labs: Recent Labs    10/29/22 1013 10/30/22 0427  WBC 15.9* 15.6*  HGB 5.7* 9.0*  HCT 23.0* 31.9*  PLT 668* 607*   BMET Recent Labs    10/29/22 1013 10/30/22 0427  NA 141 141  K 3.4* 4.4  CL 111 110  CO2 21* 22  GLUCOSE 134* 120*  BUN 7 8  CREATININE 0.70 0.62  CALCIUM 8.7* 8.7*   LFT Recent Labs    10/30/22 0427  PROT 7.8  ALBUMIN 3.5   AST 11*  ALT 9  ALKPHOS 59  BILITOT 0.7   PT/INR No results for input(s): "LABPROT", "INR" in the last 72 hours. Diagnostic imaging: CT ABDOMEN PELVIS W CONTRAST  Result Date: 10/29/2022 CLINICAL DATA:  Ulcer of colitis, bloody stool. Heart palpitations, short of breath EXAM: CT ABDOMEN AND PELVIS WITH CONTRAST TECHNIQUE: Multidetector CT imaging of the abdomen and pelvis was performed using the standard protocol following bolus administration of intravenous contrast. RADIATION DOSE REDUCTION: This exam was performed according to the departmental dose-optimization program which includes automated exposure control, adjustment of the mA and/or kV according to patient size and/or use of iterative reconstruction technique. CONTRAST:  OMNIPAQUE IOHEXOL 300 MG/ML  SOLN COMPARISON:  None Available. FINDINGS: Lower chest: Lung bases are clear. Hepatobiliary: No focal hepatic lesion. Gallbladder normal. No biliary duct dilatation. Common bile duct is normal. Pancreas: Pancreas is normal. No ductal dilatation. No pancreatic inflammation. Spleen: Normal spleen Adrenals/urinary tract: Adrenal glands and kidneys are normal. The ureters and bladder normal. Stomach/Bowel: The stomach, duodenum small-bowel normal. Terminal ileum normal. Appendix normal. There is formed stool in the ascending colon. Beginning in the descending colon there is mild mucosal thickening. There is mucosal enhancement in the distal sigmoid colon and rectum. There is fluid stool and formed stool in the rectum. No evidence of abscess or fistula. Minimal inflammation in the: Mesenteries. Vascular/Lymphatic: Abdominal aorta normal.  No lymphadenopathy Reproductive: Uterus and adnexa unremarkable. Other: Small amount free fluid in the RIGHT lower quadrant adjacent the small bowel (coronal image 55/5) Musculoskeletal: No aggressive osseous lesion. SI joints normal. Internal fixation of the LEFT femoral neck. IMPRESSION: 1. Mild mucosal  thickening and enhancement of the rectum, sigmoid colon and descending colon. Findings similar comparison CT 11/08/2021. No abscess or fistula. No significant mesenteric inflammation or adenopathy. 2. Formed stool and fluid stool in the rectum. 3. Small amount free fluid RIGHT lower quadrant adjacent small bowel. 4. Normal appendix. 5. Small bowel appears normal. Electronically Signed   By: Genevive Bi M.D.   On: 10/29/2022 13:08    IMPRESSION: Ulcerative colitis, not in remission              -Diagnosed 11/2021 via flexible sigmoidoscopy -Descending, sigmoid, rectum in distribution based on most recent endoscopy and most recent imaging             -No prior colonoscopy  to cecum             -Biologic naive, has been on long-term steroid therapy             -Quantiferon TB gold negative on 08/03/22, has had indeterminate TB gold testing on 07/26/22             -CXR completed 10/18/2022 given indeterminate TB gold testing, showed no active cardiopulmonary disease             -HBsAb negative 07/26/22             -No known extracolonic manifestations             -No disease related surgeries             -No family history colon cancer nor inflammatory bowel disease Microcytic anemia Leukocytosis Hypokalemia Hypoalbuminemia  PLAN: -C.diff testing negative -Follow-up GI pathogen PCR panel -Follow-up fecal calprotectin -Follow-up hepatitis B surface antigen, hepatitis B core antibody IgM -Continue Solu-Medrol 20 mg IV every 8 hours -Trend H/H, transfuse for hemoglobin less than 7 -Low residue/low fiber diet -Anticipate transition to oral steroid therapy tomorrow -Will reach out to [facility] GI office today and request appointment with Dr. Dulce Sellar shortly after discharge -[facility] will follow   LOS: 1 day   Liliane Shi, [facility]

## 2022-10-31 ENCOUNTER — Other Ambulatory Visit: Payer: Self-pay | Admitting: Internal Medicine

## 2022-10-31 DIAGNOSIS — K51911 Ulcerative colitis, unspecified with rectal bleeding: Secondary | ICD-10-CM | POA: Diagnosis not present

## 2022-10-31 LAB — CBC WITH DIFFERENTIAL/PLATELET
Abs Immature Granulocytes: 0.09 10*3/uL — ABNORMAL HIGH (ref 0.00–0.07)
Basophils Absolute: 0 10*3/uL (ref 0.0–0.1)
Basophils Relative: 0 %
Eosinophils Absolute: 0 10*3/uL (ref 0.0–0.5)
Eosinophils Relative: 0 %
HCT: 30.7 % — ABNORMAL LOW (ref 36.0–46.0)
Hemoglobin: 8.5 g/dL — ABNORMAL LOW (ref 12.0–15.0)
Immature Granulocytes: 1 %
Lymphocytes Relative: 5 %
Lymphs Abs: 0.9 10*3/uL (ref 0.7–4.0)
MCH: 20 pg — ABNORMAL LOW (ref 26.0–34.0)
MCHC: 27.7 g/dL — ABNORMAL LOW (ref 30.0–36.0)
MCV: 72.2 fL — ABNORMAL LOW (ref 80.0–100.0)
Monocytes Absolute: 1.8 10*3/uL — ABNORMAL HIGH (ref 0.1–1.0)
Monocytes Relative: 10 %
Neutro Abs: 14.8 10*3/uL — ABNORMAL HIGH (ref 1.7–7.7)
Neutrophils Relative %: 84 %
Platelets: 593 10*3/uL — ABNORMAL HIGH (ref 150–400)
RBC: 4.25 MIL/uL (ref 3.87–5.11)
RDW: 25.8 % — ABNORMAL HIGH (ref 11.5–15.5)
WBC: 17.6 10*3/uL — ABNORMAL HIGH (ref 4.0–10.5)
nRBC: 0.1 % (ref 0.0–0.2)

## 2022-10-31 LAB — COMPREHENSIVE METABOLIC PANEL
ALT: 10 U/L (ref 0–44)
AST: 11 U/L — ABNORMAL LOW (ref 15–41)
Albumin: 3.3 g/dL — ABNORMAL LOW (ref 3.5–5.0)
Alkaline Phosphatase: 53 U/L (ref 38–126)
Anion gap: 6 (ref 5–15)
BUN: 11 mg/dL (ref 6–20)
CO2: 25 mmol/L (ref 22–32)
Calcium: 9.1 mg/dL (ref 8.9–10.3)
Chloride: 110 mmol/L (ref 98–111)
Creatinine, Ser: 0.74 mg/dL (ref 0.44–1.00)
GFR, Estimated: >60 mL/min (ref >60)
Glucose, Bld: 104 mg/dL — ABNORMAL HIGH (ref 70–99)
Potassium: 4.7 mmol/L (ref 3.5–5.1)
Sodium: 141 mmol/L (ref 135–145)
Total Bilirubin: 0.7 mg/dL (ref 0.3–1.2)
Total Protein: 7.1 g/dL (ref 6.5–8.1)

## 2022-10-31 LAB — MAGNESIUM: Magnesium: 2.4 mg/dL (ref 1.7–2.4)

## 2022-10-31 LAB — URINE CULTURE: Culture: 20000 — AB

## 2022-10-31 LAB — CALPROTECTIN, FECAL: Calprotectin, Fecal: 2750 ug/g — ABNORMAL HIGH (ref 0–120)

## 2022-10-31 MED ORDER — PREDNISONE 10 MG PO TABS: ORAL_TABLET | ORAL | 0 refills | Status: DC

## 2022-10-31 MED ORDER — FERROUS SULFATE 325 (65 FE) MG PO TABS: 325.0000 mg | ORAL_TABLET | Freq: Every day | ORAL | 0 refills | Status: DC

## 2022-10-31 MED ORDER — OXYCODONE HCL 5 MG PO TABS: 5.0000 mg | ORAL_TABLET | Freq: Four times a day (QID) | ORAL | 0 refills | Status: AC | PRN

## 2022-10-31 NOTE — Discharge Summary (Signed)
Physician Discharge Summary  [name] MGQ:[id number] DOB: Sep 17, 2001 DOA: 10/29/2022  PCP: Mackenzie Rios  Admit date: 10/29/2022 Discharge date: 10/31/2022  Admitted From: Home Discharge disposition: Home  Recommendations at discharge:  You have been started on a slow tapering course of prednisone. Please follow-up with GI as an outpatient.  Brief narrative: Mackenzie Rios is a 21 y.o. female with PMH significant for for ulcerative colitis diagnosed in a flex sig in December 2022 on chronic prednisone.  She ran out of prednisone 2 weeks ago and he started having bloody diarrhea 3-5 stools since then. 11/27, patient presented to outside ED with worsening symptoms. CT abdomen and pelvis done concerning for ulcerative colitis flare.  Admitted to [facility]  GI consulted.   Patient was started on IV Solu-Medrol  Patient was also noted to have a severe iron deficiency anemia with a hemoglobin of 5.7 and transfused 2 units packed red blood cells.  See below for details  Subjective: Patient was seen and examined this morning. [name].  Sitting up in bed.  Not in distress.  Seen by GI earlier.  For discharge.  Patient feels ready for discharge.  Work note given at request. Chart reviewed Afebrile, hemodynamically stable  Assessment and plan: Acute flareup of ulcerative colitis  GI consult appreciated. CT PCR negative.  GI pathogen panel negative. Started on IV Solu-Medrol. Clinically improving.  Still with some blood loss but stool is formed.  Tolerating diet. GI recommended to switch to oral prednisone and discharged on a slow tapering course: prednisone 40 mg PO daily x 14 days, then prednisone 30 mg PO daily x 14 days, then prednisone 20 mg PO daily until can be seen in office. To follow-up with GI in the office.  Acute lower GI bleed due to ulcerative colitis Acute on chronic blood loss anemia Severe iron deficiency anemia Hemoglobin was low  at 5.7 at presentation. She received 2 units of PRBC transfusion. Hemoglobin stable now and at baseline between 8 and 9. Ferritin level significantly low.  1 dose of IV iron was given. Given oral iron prescription at discharge Monitor CBC as an outpatient. Recent Labs    11/08/21 1545 11/08/21 1819 11/09/21 0534 11/11/21 0550 11/12/21 0553 10/29/22 1013 10/29/22 2201 10/30/22 0427 10/31/22 0452  HGB  --  3.3*   < > 8.7* 8.4* 5.7*  --  9.0* 8.5*  MCV  --   --    < > 74.1* 75.7* 64.1*  --  71.2* 72.2*  VITAMINB12  --   --   --   --   --   --  409  --   --   FOLATE  --   --   --   --   --   --  13.0  --   --   FERRITIN  --   --   --   --   --   --  3*  --   --   TIBC 247* 224*  --   --   --   --  406  --   --   IRON 10* <5*  --   --   --   --  18*  --   --    < > = values in this interval not displayed.   Chronic leukocytosis Due to prednisone. Recent Labs  Lab 10/29/22 1013 10/30/22 0427 10/31/22 0452  WBC 15.9* 15.6* 17.6*   Wounds:  -  Discharge Exam:   Vitals:   10/30/22 2131 10/31/22 0500 10/31/22 0546 10/31/22 0916  BP: 112/76  117/84 112/81  Pulse: 65  (!) 53 83  Resp: 16  18 18   Temp: 98.4 F (36.9 C)  98.2 F (36.8 C) 98.5 F (36.9 C)  TempSrc: Oral  Oral Oral  SpO2: 100%  98% 100%  Weight:  62.5 kg    Height:        Body mass index is 23.65 kg/m.  General exam: Pleasant, young African-American female. Skin: No rashes, lesions or ulcers. HEENT: Atraumatic, normocephalic, no obvious bleeding Lungs: Clear to auscultation bilaterally CVS: Regular rate and rhythm, no murmur GI/Abd soft, nontender, nondistended, bowel sound present CNS: Alert, awake, oriented x 3 Psychiatry: Mood appropriate Extremities: No pedal edema, no calf tenderness  Follow ups:    Follow-up Information     [facility] Follow up.   Contact information: [address] [place] [id number]         [phone], MD Follow up.   Specialty: Gastroenterology Contact information: [address] [place] [phone]                 Discharge Instructions:   Discharge Instructions     Call MD for:  difficulty breathing, headache or visual disturbances   Complete by: As directed    Call MD for:  extreme fatigue   Complete by: As directed    Call MD for:  hives   Complete by: As directed    Call MD for:  persistant dizziness or light-headedness   Complete by: As directed    Call MD for:  persistant nausea and vomiting   Complete by: As directed    Call MD for:  severe uncontrolled pain   Complete by: As directed    Call MD for:  temperature >100.4   Complete by: As directed    Diet general   Complete by: As directed    Discharge instructions   Complete by: As directed    Recommendations at discharge:   You have been started on a slow tapering course of prednisone.  Please follow-up with GI as an outpatient.   General discharge instructions: Follow with Primary MD Mackenzie Rios in 7 days  Please request your PCP  to go over your hospital tests, procedures, radiology results at the follow up. Please get your medicines reviewed and adjusted.  Your PCP may decide to repeat certain labs or tests as needed. Do not drive, operate heavy machinery, perform activities at heights, swimming or participation in water activities or provide baby sitting services if your were admitted for syncope or siezures until you have seen by Primary MD or a Neurologist and advised to do so again. [place] [phone] Controlled Substance Reporting System database was reviewed. Do not drive, operate heavy machinery, perform activities at heights, swim, participate in water activities or provide baby-sitting services while on medications for pain, sleep and mood until your outpatient physician has reevaluated you and advised to do so again.  You are strongly  recommended to comply with the dose, frequency and duration of prescribed medications. Activity: As tolerated with Full fall precautions use walker/cane & assistance as needed Avoid using any recreational substances like cigarette, tobacco, alcohol, or non-prescribed drug. If you experience worsening of your admission symptoms, develop shortness of breath, life threatening emergency, suicidal or homicidal thoughts you must seek medical  attention immediately by calling 911 or calling your MD immediately  if symptoms less severe. You must read complete instructions/literature along with all the possible adverse reactions/side effects for all the medicines you take and that have been prescribed to you. Take any new medicine only after you have completely understood and accepted all the possible adverse reactions/side effects.  Wear Seat belts while driving. You were cared for by a hospitalist during your hospital stay. If you have any questions about your discharge medications or the care you received while you were in the hospital after you are discharged, you can call the unit and ask to speak with the hospitalist or the covering physician. Once you are discharged, your primary care physician will handle any further medical issues. Please note that NO REFILLS for any discharge medications will be authorized once you are discharged, as it is imperative that you return to your primary care physician (or establish a relationship with a primary care physician if you do not have one).   Increase activity slowly   Complete by: As directed        Discharge Medications:   Allergies as of 10/31/2022   No Known Allergies      Medication List     STOP taking these medications    acetaminophen 500 MG tablet Commonly known as: TYLENOL   Norethindrone Acetate-Ethinyl Estrad-FE 1-20 MG-MCG(24) tablet Commonly known as: LOESTRIN 24 FE       TAKE these medications    oxyCODONE 5 MG immediate  release tablet Commonly known as: Oxy IR/ROXICODONE Take 1 tablet (5 mg total) by mouth every 6 (six) hours as needed for up to 5 days for moderate pain.   predniSONE 10 MG tablet Commonly known as: DELTASONE Take 40 mg PO daily x 14 days, then 30 mg PO daily x 14 days, then 20 mg PO daily until seen in the office.         The results of significant diagnostics from this hospitalization (including imaging, microbiology, ancillary and laboratory) are listed below for reference.    Procedures and Diagnostic Studies:   CT ABDOMEN PELVIS W CONTRAST  Result Date: 10/29/2022 CLINICAL DATA:  Ulcer of colitis, bloody stool. Heart palpitations, short of breath EXAM: CT ABDOMEN AND PELVIS WITH CONTRAST TECHNIQUE: Multidetector CT imaging of the abdomen and pelvis was performed using the standard protocol following bolus administration of intravenous contrast. RADIATION DOSE REDUCTION: This exam was performed according to the departmental dose-optimization program which includes automated exposure control, adjustment of the mA and/or kV according to patient size and/or use of iterative reconstruction technique. CONTRAST:  OMNIPAQUE IOHEXOL 300 MG/ML  SOLN COMPARISON:  None Available. FINDINGS: Lower chest: Lung bases are clear. Hepatobiliary: No focal hepatic lesion. Gallbladder normal. No biliary duct dilatation. Common bile duct is normal. Pancreas: Pancreas is normal. No ductal dilatation. No pancreatic inflammation. Spleen: Normal spleen Adrenals/urinary tract: Adrenal glands and kidneys are normal. The ureters and bladder normal. Stomach/Bowel: The stomach, duodenum small-bowel normal. Terminal ileum normal. Appendix normal. There is formed stool in the ascending colon. Beginning in the descending colon there is mild mucosal thickening. There is mucosal enhancement in the distal sigmoid colon and rectum. There is fluid stool and formed stool in the rectum. No evidence of abscess or fistula.  Minimal inflammation in the: Mesenteries. Vascular/Lymphatic: Abdominal aorta normal.  No lymphadenopathy Reproductive: Uterus and adnexa unremarkable. Other: Small amount free fluid in the RIGHT lower quadrant adjacent the small bowel (coronal image 55/5) Musculoskeletal:  No aggressive osseous lesion. SI joints normal. Internal fixation of the LEFT femoral neck. IMPRESSION: 1. Mild mucosal thickening and enhancement of the rectum, sigmoid colon and descending colon. Findings similar comparison CT 11/08/2021. No abscess or fistula. No significant mesenteric inflammation or adenopathy. 2. Formed stool and fluid stool in the rectum. 3. Small amount free fluid RIGHT lower quadrant adjacent small bowel. 4. Normal appendix. 5. Small bowel appears normal. Electronically Signed   By: Genevive Bi M.D.   On: 10/29/2022 13:08     Labs:   Basic Metabolic Panel: Recent Labs  Lab 10/29/22 1013 10/29/22 2201 10/30/22 0427 10/31/22 0452  NA 141  --  141 141  K 3.4*  --  4.4 4.7  CL 111  --  110 110  CO2 21*  --  22 25  GLUCOSE 134*  --  120* 104*  BUN 7  --  8 11  CREATININE 0.70  --  0.62 0.74  CALCIUM 8.7*  --  8.7* 9.1  MG  --  2.0  --  2.4  PHOS  --  3.3  --   --    GFR Estimated Creatinine Clearance: 94.4 mL/min (by C-G formula based on SCr of 0.74 mg/dL). Liver Function Tests: Recent Labs  Lab 10/29/22 1013 10/30/22 0427 10/31/22 0452  AST 16 11* 11*  ALT ALKPHOS 58 59 53  BILITOT 0.2* 0.7 0.7  PROT 7.8 7.8 7.1  ALBUMIN 3.4* 3.5 3.3*   Recent Labs  Lab 10/29/22 1013  LIPASE 25   No results for input(s): "AMMONIA" in the last 168 hours. Coagulation profile No results for input(s): "INR", "PROTIME" in the last 168 hours.  CBC: Recent Labs  Lab 10/29/22 1013 10/30/22 0427 10/31/22 0452  WBC 15.9* 15.6* 17.6*  NEUTROABS  --  13.9* 14.8*  HGB 5.7* 9.0* 8.5*  HCT 23.0* 31.9* 30.7*  MCV 64.1* 71.2* 72.2*  PLT 668* 607* 593*   Cardiac Enzymes: No results for  input(s): "CKTOTAL", "CKMB", "CKMBINDEX", "TROPONINI" in the last 168 hours. BNP: Invalid input(s): "POCBNP" CBG: No results for input(s): "GLUCAP" in the last 168 hours. D-Dimer No results for input(s): "DDIMER" in the last 72 hours. Hgb A1c No results for input(s): "HGBA1C" in the last 72 hours. Lipid Profile No results for input(s): "CHOL", "HDL", "LDLCALC", "TRIG", "CHOLHDL", "LDLDIRECT" in the last 72 hours. Thyroid function studies No results for input(s): "TSH", "T4TOTAL", "T3FREE", "THYROIDAB" in the last 72 hours.  Invalid input(s): "FREET3" Anemia work up Recent Labs    10/29/22 2201  VITAMINB12 409  FOLATE 13.0  FERRITIN 3*  TIBC 406  IRON 18*   Microbiology Recent Results (from the past 240 hour(s))  Gastrointestinal Panel by PCR , Stool     Status: None   Collection Time: 10/29/22  1:22 PM   Specimen: STOOL  Result Value Ref Range Status   Campylobacter species NOT DETECTED NOT DETECTED Final   Plesimonas shigelloides NOT DETECTED NOT DETECTED Final   Salmonella species NOT DETECTED NOT DETECTED Final   Yersinia enterocolitica NOT DETECTED NOT DETECTED Final   Vibrio species NOT DETECTED NOT DETECTED Final   Vibrio cholerae NOT DETECTED NOT DETECTED Final   Enteroaggregative E coli (EAEC) NOT DETECTED NOT DETECTED Final   Enteropathogenic E coli (EPEC) NOT DETECTED NOT DETECTED Final   Enterotoxigenic E coli (ETEC) NOT DETECTED NOT DETECTED Final   Shiga like toxin producing E coli (STEC) NOT DETECTED NOT DETECTED Final   Shigella/Enteroinvasive E coli (EIEC)  NOT DETECTED NOT DETECTED Final   Cryptosporidium NOT DETECTED NOT DETECTED Final   Cyclospora cayetanensis NOT DETECTED NOT DETECTED Final   Entamoeba histolytica NOT DETECTED NOT DETECTED Final   Giardia lamblia NOT DETECTED NOT DETECTED Final   Adenovirus F40/41 NOT DETECTED NOT DETECTED Final   Astrovirus NOT DETECTED NOT DETECTED Final   Norovirus GI/GII NOT DETECTED NOT DETECTED Final    Rotavirus A NOT DETECTED NOT DETECTED Final   Sapovirus (I, II, IV, and V) NOT DETECTED NOT DETECTED Final    Comment: Performed at [facility], [address] [place], [place] [address]  C Difficile Quick Screen w PCR reflex     Status: None   Collection Time: 10/29/22  1:23 PM   Specimen: STOOL  Result Value Ref Range Status   C Diff antigen NEGATIVE NEGATIVE Final   C Diff toxin NEGATIVE NEGATIVE Final   C Diff interpretation No C. difficile detected.  Final    Comment: Performed at [facility], [address] [place], [place] [address]  Urine Culture     Status: Abnormal   Collection Time: 10/29/22  3:10 PM   Specimen: Urine, Clean Catch  Result Value Ref Range Status   Specimen Description   Final    URINE, CLEAN CATCH Performed at [facility], [address] [place], [place] [address]    Special Requests   Final    NONE Performed at [facility], [address] [place], [place] [address]    Culture (A)  Final    20,000 COLONIES/mL ESCHERICHIA COLI >=100,000 COLONIES/mL LACTOBACILLUS SPECIES Standardized susceptibility testing for this organism is not available. Performed at [facility], [address] [place], [place] [address]    Report Status 10/31/2022 FINAL  Final   Organism ID, Bacteria ESCHERICHIA COLI (A)  Final      Susceptibility   Escherichia coli - MIC*    AMPICILLIN >=32 RESISTANT Resistant     CEFAZOLIN <=4 SENSITIVE Sensitive     CEFEPIME <=0.12 SENSITIVE Sensitive     CEFTRIAXONE <=0.25 SENSITIVE Sensitive     CIPROFLOXACIN >=4 RESISTANT Resistant     GENTAMICIN <=1 SENSITIVE Sensitive     IMIPENEM <=0.25 SENSITIVE Sensitive     NITROFURANTOIN <=16 SENSITIVE Sensitive     TRIMETH/SULFA <=20 SENSITIVE Sensitive     AMPICILLIN/SULBACTAM 16 INTERMEDIATE Intermediate     PIP/TAZO <=4 SENSITIVE Sensitive     * 20,000 COLONIES/mL ESCHERICHIA COLI    Time coordinating  discharge: 35 minutes  Signed: Hersh Minney  [facility] 10/31/2022, 4:27 PM

## 2022-10-31 NOTE — Progress Notes (Signed)
[facility] Progress Note  SUBJECTIVE:   Interval history: Mackenzie Rios was seen and evaluated today at bedside.  She is resting comfortably in bed with no abdominal pain.  Denied nausea or vomiting.  No chest pain or shortness of breath.  Noted that her stools are becoming more formed but she still has blood.  Hepatitis B surface antigen negative, hepatitis B core antibody IgM negative.  Fecal calprotectin pending.  Past Medical History:  Diagnosis Date   Chlamydia infection    Colitis    Past Surgical History:  Procedure Laterality Date   BIOPSY  11/10/2021   Procedure: BIOPSY;  Surgeon: Arta Silence, MD;  Location: [facility];  Service: Endoscopy;;   FEMUR CLOSED REDUCTION     FLEXIBLE SIGMOIDOSCOPY N/A 11/10/2021   Procedure: FLEXIBLE SIGMOIDOSCOPY;  Surgeon: Arta Silence, MD;  Location: [facility];  Service: Endoscopy;  Laterality: N/A;   Current Facility-Administered Medications  Medication Dose Route Frequency Provider Last Rate Last Admin   acetaminophen (TYLENOL) tablet 650 mg  650 mg Oral Q6H PRN Eugenie Filler, MD   650 mg at 10/29/22 1630   Or   acetaminophen (TYLENOL) suppository 650 mg  650 mg Rectal Q6H PRN Eugenie Filler, MD       acetaminophen (TYLENOL) tablet 650 mg  650 mg Oral Once Eugenie Filler, MD       magnesium citrate solution 1 Bottle  1 Bottle Oral Once PRN Eugenie Filler, MD       methylPREDNISolone sodium succinate (SOLU-MEDROL) 40 mg/mL injection 20 mg  20 mg Intravenous Q8H Eugenie Filler, MD   20 mg at 10/31/22 0534   metoprolol tartrate (LOPRESSOR) injection 5 mg  5 mg Intravenous Q6H PRN Eugenie Filler, MD       morphine (PF) 2 MG/ML injection 1 mg  1 mg Intravenous Q4H PRN Eugenie Filler, MD       ondansetron [facility]) tablet 4 mg  4 mg Oral Q6H PRN Eugenie Filler, MD       Or   ondansetron [facility]) injection 4 mg  4 mg Intravenous Q6H PRN Eugenie Filler, MD       oxyCODONE (Oxy IR/ROXICODONE)  immediate release tablet 5 mg  5 mg Oral Q4H PRN Eugenie Filler, MD       senna-docusate (Senokot-S) tablet 1 tablet  1 tablet Oral QHS PRN Eugenie Filler, MD       sodium chloride flush (NS) 0.9 % injection 3 mL  3 mL Intravenous Q12H Eugenie Filler, MD   3 mL at 10/31/22 0927   sorbitol 70 % solution 30 mL  30 mL Oral Daily PRN Eugenie Filler, MD       Allergies as of 10/29/2022   (No Known Allergies)   Review of Systems:  Review of Systems  Respiratory:  Negative for shortness of breath.   Cardiovascular:  Negative for chest pain.  Gastrointestinal:  Positive for blood in stool and diarrhea. Negative for abdominal pain, nausea and vomiting.    OBJECTIVE:   Temp:  [97.9 F (36.6 C)-98.5 F (36.9 C)] 98.5 F (36.9 C) (11/29 0916) Pulse Rate:  [53-92] 83 (11/29 0916) Resp:  [16-18] 18 (11/29 0916) BP: (112-118)/(76-84) 112/81 (11/29 0916) SpO2:  [98 %-100 %] 100 % (11/29 0916) Weight:  [62.5 kg] 62.5 kg (11/29 0500) Last BM Date : 10/31/22 Physical Exam Constitutional:      General: She is not in acute distress.  Appearance: She is not ill-appearing, toxic-appearing or diaphoretic.  Cardiovascular:     Rate and Rhythm: Normal rate and regular rhythm.  Pulmonary:     Effort: No respiratory distress.     Breath sounds: Normal breath sounds.  Abdominal:     General: Bowel sounds are normal. There is no distension.     Palpations: Abdomen is soft.     Tenderness: There is no abdominal tenderness. There is no guarding.  Neurological:     Mental Status: She is alert.     Labs: Recent Labs    10/29/22 1013 10/30/22 0427 10/31/22 0452  WBC 15.9* 15.6* 17.6*  HGB 5.7* 9.0* 8.5*  HCT 23.0* 31.9* 30.7*  PLT 668* 607* 593*   BMET Recent Labs    10/29/22 1013 10/30/22 0427 10/31/22 0452  NA 141 141 141  K 3.4* 4.4 4.7  CL 111 110 110  CO2 21* 22 25  GLUCOSE 134* 120* 104*  BUN 7 8 11   CREATININE 0.70 0.62 0.74  CALCIUM 8.7* 8.7* 9.1    LFT Recent Labs    10/31/22 0452  PROT 7.1  ALBUMIN 3.3*  AST 11*  ALT 10  ALKPHOS 53  BILITOT 0.7   PT/INR No results for input(s): "LABPROT", "INR" in the last 72 hours. Diagnostic imaging: CT ABDOMEN PELVIS W CONTRAST  Result Date: 10/29/2022 CLINICAL DATA:  Ulcer of colitis, bloody stool. Heart palpitations, short of breath EXAM: CT ABDOMEN AND PELVIS WITH CONTRAST TECHNIQUE: Multidetector CT imaging of the abdomen and pelvis was performed using the standard protocol following bolus administration of intravenous contrast. RADIATION DOSE REDUCTION: This exam was performed according to the departmental dose-optimization program which includes automated exposure control, adjustment of the mA and/or kV according to patient size and/or use of iterative reconstruction technique. CONTRAST:  13mL OMNIPAQUE IOHEXOL 300 MG/ML  SOLN COMPARISON:  None Available. FINDINGS: Lower chest: Lung bases are clear. Hepatobiliary: No focal hepatic lesion. Gallbladder normal. No biliary duct dilatation. Common bile duct is normal. Pancreas: Pancreas is normal. No ductal dilatation. No pancreatic inflammation. Spleen: Normal spleen Adrenals/urinary tract: Adrenal glands and kidneys are normal. The ureters and bladder normal. Stomach/Bowel: The stomach, duodenum small-bowel normal. Terminal ileum normal. Appendix normal. There is formed stool in the ascending colon. Beginning in the descending colon there is mild mucosal thickening. There is mucosal enhancement in the distal sigmoid colon and rectum. There is fluid stool and formed stool in the rectum. No evidence of abscess or fistula. Minimal inflammation in the: Mesenteries. Vascular/Lymphatic: Abdominal aorta normal.  No lymphadenopathy Reproductive: Uterus and adnexa unremarkable. Other: Small amount free fluid in the RIGHT lower quadrant adjacent the small bowel (coronal image 55/5) Musculoskeletal: No aggressive osseous lesion. SI joints normal. Internal  fixation of the LEFT femoral neck. IMPRESSION: 1. Mild mucosal thickening and enhancement of the rectum, sigmoid colon and descending colon. Findings similar comparison CT 11/08/2021. No abscess or fistula. No significant mesenteric inflammation or adenopathy. 2. Formed stool and fluid stool in the rectum. 3. Small amount free fluid RIGHT lower quadrant adjacent small bowel. 4. Normal appendix. 5. Small bowel appears normal. Electronically Signed   By: Suzy Bouchard M.D.   On: 10/29/2022 13:08    IMPRESSION: Ulcerative colitis, not in remission              -Diagnosed 11/2021 via flexible sigmoidoscopy -Descending, sigmoid, rectum in distribution based on most recent endoscopy and most recent imaging             -  No prior colonoscopy to cecum             -Biologic naive, has been on long-term steroid therapy             -Quantiferon TB gold negative on 08/03/22, has had indeterminate TB gold testing on 07/26/22             -CXR completed 10/18/2022 given indeterminate TB gold testing, showed no active cardiopulmonary disease             -HBsAg - 10/30/2022, hepatitis B core antibody IgM - 10/30/2022             -No known extracolonic manifestations             -No disease related surgeries             -No family history colon cancer nor inflammatory bowel disease Hematochezia secondary to above Microcytic anemia Leukocytosis Hypokalemia Hypoalbuminemia  PLAN: -Patient appears to be stable for discharge from GI standpoint -Recommend prolonged taper of oral steroids as patient has been on steroid therapy for prolonged period of time prior to admission -Recommend prednisone 40 mg p.o. daily x 14 days, then prednisone 30 mg p.o. daily x 14 days, then prednisone 20 mg p.o. daily indefinitely until can be seen in GI office -Patient does not have PCP, given profound microcytic anemia, recommend patient have BBC testing through GI office within 1-2 weeks of discharge -Follow-up fecal calprotectin  testing -Follow-up with [facility], in outpatient setting in 2-3 weeks after discharge -Gastroenterology team will respectfully sign off and be available for any questions that arise   LOS: 2 days   Liliane Shi, [facility]

## 2022-10-31 NOTE — Progress Notes (Signed)
Patient was given discharge instructions, and all questions were answered.  Patient was stable for discharge and was taken to the main exit by wheelchair. 

## 2022-11-30 ENCOUNTER — Ambulatory Visit (INDEPENDENT_AMBULATORY_CARE_PROVIDER_SITE_OTHER): Payer: BC Managed Care – PPO | Admitting: Nurse Practitioner

## 2022-11-30 ENCOUNTER — Encounter: Payer: Self-pay | Admitting: Nurse Practitioner

## 2022-11-30 VITALS — BP 111/84 | HR 90 | Temp 97.7°F | Ht 64.0 in | Wt 146.2 lb

## 2022-11-30 DIAGNOSIS — F1290 Cannabis use, unspecified, uncomplicated: Secondary | ICD-10-CM | POA: Insufficient documentation

## 2022-11-30 DIAGNOSIS — K51911 Ulcerative colitis, unspecified with rectal bleeding: Secondary | ICD-10-CM

## 2022-11-30 DIAGNOSIS — Z9229 Personal history of other drug therapy: Secondary | ICD-10-CM

## 2022-11-30 DIAGNOSIS — D500 Iron deficiency anemia secondary to blood loss (chronic): Secondary | ICD-10-CM

## 2022-11-30 NOTE — Assessment & Plan Note (Signed)
.  Varicella vaccine is contraindicated because patient is on chronic steroids. Will check for her immunity to varicella today, she received only 1 dose of varicella vaccine in the past.    - Varicella Zoster Abs, IgG/IgM

## 2022-11-30 NOTE — Assessment & Plan Note (Signed)
She received 2 units of RBC about a month ago at the hospital. Intolerant to p.o. iron Patient referred to hematology for possible iron infusion Lab Results  Component Value Date   WBC 17.6 (H) 10/31/2022   HGB 8.5 (L) 10/31/2022   HCT 30.7 (L) 10/31/2022   MCV 72.2 (L) 10/31/2022   PLT 593 (H) 10/31/2022

## 2022-11-30 NOTE — Patient Instructions (Signed)

## 2022-11-30 NOTE — Progress Notes (Signed)
New Patient Office Visit  Subjective:  Patient ID: Mackenzie Rios, female    DOB: Dec 01, 2001  Age: 21 y.o. MRN: [id number]  CC:  Chief Complaint  Patient presents with   Establish Care    HPI Mackenzie Rios is a 21 y.o. female with past medical history of ulcerative colitis diagnosed in December 2022 on chronic prednisone presents to establish care for her chronic medical conditions.  She is currently followed by GI.  Has no PCP.  She was admitted in the hospital about a month ago for her ulcerative colitis acute flareup, acute lower GI bleed due to ulcerative colitis, severe iron deficiency anemia.    Ulcerative colitis.  She is on chronic prednisone 20 mg daily.  She is followed by GI.  She continues to have blood in her stool.  Patient stated that GI plans on starting her on Humira.  She has received 1 dose of varicella vaccination on 03/07/2000.  GI wants to confirm immunity to varicella before proceeding with Humira therapy.  She denies history of chickenpox.    Iron deficiency anemia .  Secondary to her ulcerative colitis . she received 2 units of red blood cells transfusion during her last hospital admission.  She has stopped taking oral iron supplement because of GI upset.  Patient denies  fatigue, shortness of breath. iron deficiency anemia    She is up-to-date with her Pap exam and has her last CPE completed by OB/GYN.      Past Medical History:  Diagnosis Date   Chlamydia infection    Colitis     Past Surgical History:  Procedure Laterality Date   BIOPSY  11/10/2021   Procedure: BIOPSY;  Surgeon: Willis Modena, MD;  Location: [facility];  Service: Endoscopy;;   FEMUR CLOSED REDUCTION     FEMUR SURGERY Left 2019   FLEXIBLE SIGMOIDOSCOPY N/A 11/10/2021   Procedure: FLEXIBLE SIGMOIDOSCOPY;  Surgeon: Willis Modena, MD;  Location: [facility];  Service: Endoscopy;  Laterality: N/A;    Family History  Problem Relation Age of Onset   Cancer - Colon Neg Hx      Social History   Socioeconomic History   Marital status: Single    Spouse name: Not on file   Number of children: 0   Years of education: Not on file   Highest education level: Not on file  Occupational History   Not on file  Tobacco Use   Smoking status: Never   Smokeless tobacco: Never  Vaping Use   Vaping Use: Never used  Substance and Sexual Activity   Alcohol use: Yes    Comment: occassional   Drug use: Yes    Types: Marijuana   Sexual activity: Yes    Birth control/protection: None  Other Topics Concern   Not on file  Social History Narrative   Lives by herself   Social Determinants of Health   Financial Resource Strain: Not on file  Food Insecurity: No Food Insecurity (10/29/2022)   Hunger Vital Sign    Worried About Running Out of Food in the Last Year: Never true    Ran Out of Food in the Last Year: Never true  Transportation Needs: No Transportation Needs (10/29/2022)   PRAPARE - Administrator, Civil Service (Medical): No    Lack of Transportation (Non-Medical): No  Physical Activity: Not on file  Stress: Not on file  Social Connections: Not on file  Intimate Partner Violence: Not At Risk (10/29/2022)   Humiliation,  Afraid, Rape, and Kick questionnaire    Fear of Current or Ex-Partner: No    Emotionally Abused: No    Physically Abused: No    Sexually Abused: No    ROS Review of Systems  Constitutional:  Negative for activity change, appetite change, chills, diaphoresis, fatigue and fever.  Respiratory: Negative.  Negative for apnea, cough, shortness of breath and wheezing.   Cardiovascular: Negative.  Negative for chest pain, palpitations and leg swelling.  Gastrointestinal:  Positive for anal bleeding. Negative for abdominal distention and abdominal pain.  Endocrine: Negative.   Musculoskeletal: Negative.   Neurological:  Negative for dizziness, facial asymmetry, light-headedness, numbness and headaches.  Psychiatric/Behavioral:   Negative for agitation, behavioral problems, confusion, decreased concentration and dysphoric mood.     Objective:   Today's Vitals: BP 111/84   Pulse 90   Temp 97.7 F (36.5 C)   Ht 5\' 4"  (1.626 m)   Wt 146 lb 3.2 oz (66.3 kg)   LMP 10/26/2022 (Exact Date) Comment: neg upreg in er today.  SpO2 100%   BMI 25.10 kg/m   Physical Exam Constitutional:      General: She is not in acute distress.    Appearance: Normal appearance. She is not ill-appearing, toxic-appearing or diaphoretic.  Eyes:     General: No scleral icterus.       Right eye: No discharge.        Left eye: No discharge.     Conjunctiva/sclera: Conjunctivae normal.  Cardiovascular:     Rate and Rhythm: Normal rate.     Pulses: Normal pulses.     Heart sounds: No murmur heard.    No friction rub. No gallop.  Pulmonary:     Effort: Pulmonary effort is normal. No respiratory distress.     Breath sounds: No stridor. No wheezing, rhonchi or rales.  Chest:     Chest wall: No tenderness.  Abdominal:     General: There is no distension.     Palpations: Abdomen is soft.     Tenderness: There is no abdominal tenderness. There is no guarding.  Musculoskeletal:        General: Normal range of motion.  Skin:    General: Skin is warm and dry.     Coloration: Skin is not jaundiced or pale.  Neurological:     Mental Status: She is alert and oriented to person, place, and time.     Cranial Nerves: No cranial nerve deficit.     Motor: No weakness.     Coordination: Coordination normal.     Gait: Gait normal.  Psychiatric:        Mood and Affect: Mood normal.        Behavior: Behavior normal.        Thought Content: Thought content normal.        Judgment: Judgment normal.     Assessment & Plan:   Problem List Items Addressed This Visit       Digestive   Ulcerative colitis with rectal bleeding ([place])    Currently established with GI takes prednisone 10 mg daily.  GI plans on starting her on Humira therapy.   Checking labs for varicella immunity today.  She was encouraged to maintain close follow-up with GI        Other   IDA (iron deficiency anemia)    She received 2 units of RBC about a month ago at the hospital. Intolerant to p.o. iron Patient referred to hematology for possible iron  infusion Lab Results  Component Value Date   WBC 17.6 (H) 10/31/2022   HGB 8.5 (L) 10/31/2022   HCT 30.7 (L) 10/31/2022   MCV 72.2 (L) 10/31/2022   PLT 593 (H) 10/31/2022         Relevant Orders   Ambulatory referral to Hematology / Oncology   Marijuana smoker    Need to avoid smoking marijuana including risk of addiction to illicit drugs was discussed with the patient.  She verbalized understanding.      History of receipt of varicella zoster immune globulin - Primary    .Varicella vaccine is contraindicated because patient is on chronic steroids. Will check for her immunity to varicella today, she received only 1 dose of varicella vaccine in the past.    - Varicella Zoster Abs, IgG/IgM        Relevant Orders   Varicella Zoster Abs, IgG/IgM    Outpatient Encounter Medications as of 11/30/2022  Medication Sig   ferrous sulfate 325 (65 FE) MG tablet Take 1 tablet (325 mg total) by mouth daily with breakfast.   predniSONE (DELTASONE) 10 MG tablet Take 40 mg PO daily x 14 days, then 30 mg PO daily x 14 days, then 20 mg PO daily until seen in the office.   No facility-administered encounter medications on file as of 11/30/2022.    Follow-up: Return in about 6 months (around 06/01/2023) for collitis .   Renee Rival, FNP

## 2022-11-30 NOTE — Assessment & Plan Note (Signed)
Need to avoid smoking marijuana including risk of addiction to illicit drugs was discussed with the patient.  She verbalized understanding.

## 2022-11-30 NOTE — Assessment & Plan Note (Addendum)
Currently established with GI takes prednisone 10 mg daily.  GI plans on starting her on Humira therapy.  Checking labs for varicella immunity today.  She was encouraged to maintain close follow-up with GI

## 2022-12-05 LAB — VARICELLA ZOSTER ABS, IGG/IGM
Varicella IgM: <0.91 index (ref 0.00–0.90)
Varicella zoster IgG: 195 index (ref >165)

## 2022-12-05 NOTE — Progress Notes (Signed)
The presence of IgG antibody generally indicates past exposure and immunity. The presence of IgM antibody indicates recent infection. It appears that the patient is immune against varicella based on this labs. She can present the lab results to her GI specialist.

## 2022-12-19 ENCOUNTER — Other Ambulatory Visit (HOSPITAL_COMMUNITY)
Admission: RE | Admit: 2022-12-19 | Discharge: 2022-12-19 | Disposition: A | Discharge status: Home / Self Care | Payer: BC Managed Care – PPO | Source: Ambulatory Visit | Attending: Obstetrics & Gynecology | Admitting: Obstetrics & Gynecology

## 2022-12-19 ENCOUNTER — Ambulatory Visit (INDEPENDENT_AMBULATORY_CARE_PROVIDER_SITE_OTHER): Payer: BC Managed Care – PPO

## 2022-12-19 VITALS — BP 115/78 | HR 93

## 2022-12-19 DIAGNOSIS — A749 Chlamydial infection, unspecified: Secondary | ICD-10-CM

## 2022-12-19 DIAGNOSIS — A5602 Chlamydial vulvovaginitis: Secondary | ICD-10-CM

## 2022-12-19 NOTE — Progress Notes (Signed)
SUBJECTIVE:  22 y.o. female who desires a STI screen./TOC for +CT  Denies abnormal vaginal discharge, bleeding or significant pelvic pain. No UTI symptoms. Denies history of known exposure to STD.  No LMP recorded.  OBJECTIVE:  She appears well.   ASSESSMENT:  STI Screen   PLAN:  Pt offered STI blood screening-not indicated GC, chlamydia, and trichomonas probe sent to lab.  Treatment: To be determined once lab results are received.  Pt follow up as needed.

## 2022-12-20 ENCOUNTER — Inpatient Hospital Stay: Payer: BC Managed Care – PPO

## 2022-12-20 ENCOUNTER — Other Ambulatory Visit: Payer: Self-pay | Admitting: Family Medicine

## 2022-12-20 ENCOUNTER — Other Ambulatory Visit: Payer: Self-pay

## 2022-12-20 ENCOUNTER — Inpatient Hospital Stay: Payer: BC Managed Care – PPO | Attending: Hematology and Oncology | Admitting: Hematology and Oncology

## 2022-12-20 VITALS — BP 124/84 | HR 102 | Temp 97.5°F | Resp 18 | Wt 146.3 lb

## 2022-12-20 DIAGNOSIS — D509 Iron deficiency anemia, unspecified: Secondary | ICD-10-CM | POA: Insufficient documentation

## 2022-12-20 DIAGNOSIS — Z79899 Other long term (current) drug therapy: Secondary | ICD-10-CM | POA: Insufficient documentation

## 2022-12-20 LAB — CBC WITH DIFFERENTIAL (CANCER CENTER ONLY)
Abs Immature Granulocytes: 0.01 10*3/uL (ref 0.00–0.07)
Basophils Absolute: 0 10*3/uL (ref 0.0–0.1)
Basophils Relative: 0 %
Eosinophils Absolute: 0.2 10*3/uL (ref 0.0–0.5)
Eosinophils Relative: 3 %
HCT: 36.8 % (ref 36.0–46.0)
Hemoglobin: 11.5 g/dL — ABNORMAL LOW (ref 12.0–15.0)
Immature Granulocytes: 0 %
Lymphocytes Relative: 24 %
Lymphs Abs: 1.3 10*3/uL (ref 0.7–4.0)
MCH: 25.4 pg — ABNORMAL LOW (ref 26.0–34.0)
MCHC: 31.3 g/dL (ref 30.0–36.0)
MCV: 81.4 fL (ref 80.0–100.0)
Monocytes Absolute: 0.8 10*3/uL (ref 0.1–1.0)
Monocytes Relative: 14 %
Neutro Abs: 3.1 10*3/uL (ref 1.7–7.7)
Neutrophils Relative %: 59 %
Platelet Count: 249 10*3/uL (ref 150–400)
RBC: 4.52 MIL/uL (ref 3.87–5.11)
RDW: 19.6 % — ABNORMAL HIGH (ref 11.5–15.5)
WBC Count: 5.3 10*3/uL (ref 4.0–10.5)
nRBC: 0 % (ref 0.0–0.2)

## 2022-12-20 LAB — FOLATE: Folate: 18.5 ng/mL (ref >5.9)

## 2022-12-20 LAB — IRON AND IRON BINDING CAPACITY (CC-WL,HP ONLY)
Iron: 24 ug/dL — ABNORMAL LOW (ref 28–170)
Saturation Ratios: 5 % — ABNORMAL LOW (ref 10.4–31.8)
TIBC: 448 ug/dL (ref 250–450)
UIBC: 424 ug/dL

## 2022-12-20 LAB — CERVICOVAGINAL ANCILLARY ONLY
Bacterial Vaginitis (gardnerella): NEGATIVE
Candida Glabrata: NEGATIVE
Candida Vaginitis: POSITIVE — AB
Chlamydia: NEGATIVE
Comment: NEGATIVE
Comment: NEGATIVE
Comment: NEGATIVE
Comment: NEGATIVE
Comment: NEGATIVE
Comment: NORMAL
Neisseria Gonorrhea: NEGATIVE
Trichomonas: NEGATIVE

## 2022-12-20 LAB — VITAMIN B12: Vitamin B-12: 465 pg/mL (ref 180–914)

## 2022-12-20 MED ORDER — FLUCONAZOLE 150 MG PO TABS: 150.0000 mg | ORAL_TABLET | Freq: Every day | ORAL | 2 refills | Status: DC

## 2022-12-20 NOTE — Progress Notes (Signed)
Findings are C/W yeast--rx sent in.

## 2022-12-20 NOTE — Assessment & Plan Note (Signed)
Lab review: 10/29/2022: Hemoglobin 5.7, MCV 64 (blood transfusion and 1 dose of Feraheme given), iron saturation 4%, ferritin 3 10/31/2022: Hemoglobin 8.5, MCV 72.2, platelets 593

## 2022-12-21 LAB — FERRITIN: Ferritin: 10 ng/mL — ABNORMAL LOW (ref 11–307)

## 2022-12-24 ENCOUNTER — Telehealth: Payer: Self-pay | Admitting: Hematology and Oncology

## 2022-12-24 NOTE — Telephone Encounter (Signed)
Scheduled appointments per los. Patient is aware of all made appointments. 

## 2022-12-25 ENCOUNTER — Other Ambulatory Visit: Payer: Self-pay | Admitting: Hematology and Oncology

## 2022-12-26 ENCOUNTER — Ambulatory Visit: Payer: BC Managed Care – PPO | Admitting: Nurse Practitioner

## 2023-01-02 ENCOUNTER — Other Ambulatory Visit: Payer: Self-pay

## 2023-01-02 ENCOUNTER — Inpatient Hospital Stay: Payer: BC Managed Care – PPO

## 2023-01-02 VITALS — BP 118/73 | HR 76 | Resp 16

## 2023-01-02 DIAGNOSIS — D509 Iron deficiency anemia, unspecified: Secondary | ICD-10-CM

## 2023-01-02 MED ORDER — SODIUM CHLORIDE 0.9 % IV SOLN: Freq: Once | INTRAVENOUS | Status: AC

## 2023-01-02 MED ORDER — SODIUM CHLORIDE 0.9 % IV SOLN
300.0000 mg | Freq: Once | INTRAVENOUS | Status: AC
  Administered 2023-01-02: 300 mg via INTRAVENOUS
  Filled 2023-01-02: qty 300

## 2023-01-02 NOTE — Progress Notes (Signed)
Patient completed iron infusion without issue. Observed for 30 minutes post infusion. VSS at discharge. Ambulatory to lobby.

## 2023-01-02 NOTE — Patient Instructions (Signed)
Iron Sucrose Injection What is this medication? IRON SUCROSE (EYE ern SOO krose) treats low levels of iron (iron deficiency anemia) in people with kidney disease. Iron is a mineral that plays an important role in making red blood cells, which carry oxygen from your lungs to the rest of your body. This medicine may be used for other purposes; ask your health care provider or pharmacist if you have questions. COMMON BRAND NAME(S): Venofer What should I tell my care team before I take this medication? They need to know if you have any of these conditions: Anemia not caused by low iron levels Heart disease High levels of iron in the blood Kidney disease Liver disease An unusual or allergic reaction to iron, other medications, foods, dyes, or preservatives Pregnant or trying to get pregnant Breastfeeding How should I use this medication? This medication is for infusion into a vein. It is given in a hospital or clinic setting. Talk to your care team about the use of this medication in children. While this medication may be prescribed for children as young as 2 years for selected conditions, precautions do apply. Overdosage: If you think you have taken too much of this medicine contact a poison control center or emergency room at once. NOTE: This medicine is only for you. Do not share this medicine with others. What if I miss a dose? Keep appointments for follow-up doses. It is important not to miss your dose. Call your care team if you are unable to keep an appointment. What may interact with this medication? Do not take this medication with any of the following: Deferoxamine Dimercaprol Other iron products This medication may also interact with the following: Chloramphenicol Deferasirox This list may not describe all possible interactions. Give your health care provider a list of all the medicines, herbs, non-prescription drugs, or dietary supplements you use. Also tell them if you smoke,  drink alcohol, or use illegal drugs. Some items may interact with your medicine. What should I watch for while using this medication? Visit your care team regularly. Tell your care team if your symptoms do not start to get better or if they get worse. You may need blood work done while you are taking this medication. You may need to follow a special diet. Talk to your care team. Foods that contain iron include: whole grains/cereals, dried fruits, beans, or peas, leafy green vegetables, and organ meats (liver, kidney). What side effects may I notice from receiving this medication? Side effects that you should report to your care team as soon as possible: Allergic reactions--skin rash, itching, hives, swelling of the face, lips, tongue, or throat Low blood pressure--dizziness, feeling faint or lightheaded, blurry vision Shortness of breath Side effects that usually do not require medical attention (report to your care team if they continue or are bothersome): Flushing Headache Joint pain Muscle pain Nausea Pain, redness, or irritation at injection site This list may not describe all possible side effects. Call your doctor for medical advice about side effects. You may report side effects to FDA at 1-800-FDA-1088. Where should I keep my medication? This medication is given in a hospital or clinic and will not be stored at home. NOTE: This sheet is a summary. It may not cover all possible information. If you have questions about this medicine, talk to your doctor, pharmacist, or health care provider.  2023 Elsevier/Gold Standard (2021-03-02 00:00:00)

## 2023-01-09 ENCOUNTER — Inpatient Hospital Stay: Payer: BC Managed Care – PPO

## 2023-01-11 MED FILL — Iron Sucrose Inj 20 MG/ML (Fe Equiv): INTRAVENOUS | Qty: 15 | Status: AC

## 2023-01-12 ENCOUNTER — Inpatient Hospital Stay: Payer: BC Managed Care – PPO | Attending: Hematology and Oncology

## 2023-01-16 ENCOUNTER — Inpatient Hospital Stay: Payer: BC Managed Care – PPO

## 2023-01-18 MED FILL — Iron Sucrose Inj 20 MG/ML (Fe Equiv): INTRAVENOUS | Qty: 15 | Status: AC

## 2023-01-19 ENCOUNTER — Inpatient Hospital Stay: Payer: BC Managed Care – PPO

## 2023-01-29 ENCOUNTER — Telehealth: Payer: Self-pay | Admitting: Hematology and Oncology

## 2023-01-29 NOTE — Telephone Encounter (Signed)
Patient aware of upcoming appointments  

## 2023-02-07 ENCOUNTER — Telehealth: Payer: Self-pay | Admitting: *Deleted

## 2023-02-07 ENCOUNTER — Inpatient Hospital Stay: Payer: BC Managed Care – PPO | Attending: Hematology and Oncology

## 2023-02-07 NOTE — Telephone Encounter (Signed)
Pt second no show to IV iron infusion.  RN placed call to pt who stated she prefers to sleep in and is requesting afternoon infusion appts.  Message sent to scheduling team.

## 2023-02-14 ENCOUNTER — Inpatient Hospital Stay: Payer: BC Managed Care – PPO

## 2023-02-15 ENCOUNTER — Inpatient Hospital Stay: Payer: BC Managed Care – PPO | Admitting: Hematology and Oncology

## 2023-02-21 ENCOUNTER — Inpatient Hospital Stay: Payer: BC Managed Care – PPO

## 2023-03-05 ENCOUNTER — Inpatient Hospital Stay: Payer: BC Managed Care – PPO

## 2023-03-07 ENCOUNTER — Inpatient Hospital Stay: Payer: BC Managed Care – PPO | Admitting: Hematology and Oncology

## 2023-03-13 ENCOUNTER — Ambulatory Visit: Payer: BC Managed Care – PPO | Admitting: Nurse Practitioner

## 2023-04-20 ENCOUNTER — Other Ambulatory Visit: Payer: Self-pay | Admitting: Family Medicine

## 2023-04-23 ENCOUNTER — Encounter: Payer: Self-pay | Admitting: Hematology and Oncology

## 2023-05-01 ENCOUNTER — Ambulatory Visit: Payer: BC Managed Care – PPO | Admitting: Gastroenterology

## 2023-05-02 ENCOUNTER — Other Ambulatory Visit (HOSPITAL_COMMUNITY)
Admission: RE | Admit: 2023-05-02 | Discharge: 2023-05-02 | Disposition: A | Discharge status: Home / Self Care | Payer: BC Managed Care – PPO | Source: Ambulatory Visit | Attending: Advanced Practice Midwife | Admitting: Advanced Practice Midwife

## 2023-05-02 ENCOUNTER — Ambulatory Visit (INDEPENDENT_AMBULATORY_CARE_PROVIDER_SITE_OTHER): Payer: BC Managed Care – PPO

## 2023-05-02 VITALS — BP 108/70 | HR 62

## 2023-05-02 DIAGNOSIS — N898 Other specified noninflammatory disorders of vagina: Secondary | ICD-10-CM | POA: Diagnosis present

## 2023-05-02 MED ORDER — FLUCONAZOLE 150 MG PO TABS: 150.0000 mg | ORAL_TABLET | Freq: Once | ORAL | 3 refills | Status: AC

## 2023-05-02 NOTE — Progress Notes (Signed)
ATTESTATION OF SUPERVISION OF RN or CMA: Evaluation and management procedures were performed by the RN and/or CMA under my supervision and collaboration. I have reviewed the nursing note and chart and agree with the management and plan for this patient.  Clayton Bibles, CNM

## 2023-05-02 NOTE — Progress Notes (Signed)
SUBJECTIVE:  22 y.o. female who desires a STI screen. Notes vaginal discharge more after cycle usually yeast, no bleeding or significant pelvic pain. No UTI symptoms.  No LMP recorded.  OBJECTIVE:  She appears well.   ASSESSMENT:  STI Screen  Yeast infections after cycle pt requesting Diflucan today.  PLAN:  Pt offered STI blood screening-not indicated GC, chlamydia, and trichomonas,BV and Yeast probe sent to lab.  Treatment: To be determined once lab results are received. Rx for diflucan  Pt follow up as needed.

## 2023-05-03 LAB — CERVICOVAGINAL ANCILLARY ONLY
Bacterial Vaginitis (gardnerella): POSITIVE — AB
Candida Glabrata: NEGATIVE
Candida Vaginitis: NEGATIVE
Chlamydia: POSITIVE — AB
Comment: NEGATIVE
Comment: NEGATIVE
Comment: NEGATIVE
Comment: NEGATIVE
Comment: NEGATIVE
Comment: NORMAL
Neisseria Gonorrhea: NEGATIVE
Trichomonas: NEGATIVE

## 2023-05-03 MED ORDER — AZITHROMYCIN 250 MG PO TABS: 1000.0000 mg | ORAL_TABLET | Freq: Once | ORAL | 0 refills | Status: AC

## 2023-05-03 NOTE — Addendum Note (Signed)
Addended by: Calvert Cantor on: 05/03/2023 08:24 PM   Modules accepted: Orders

## 2023-05-06 ENCOUNTER — Other Ambulatory Visit: Payer: Self-pay

## 2023-05-06 DIAGNOSIS — A749 Chlamydial infection, unspecified: Secondary | ICD-10-CM

## 2023-05-06 MED ORDER — AZITHROMYCIN 500 MG PO TABS: 1000.0000 mg | ORAL_TABLET | Freq: Once | ORAL | 0 refills | Status: AC

## 2023-05-06 NOTE — Progress Notes (Signed)
Pt was not able to keep first dose of treatment medication down New Rx sent.

## 2023-05-29 ENCOUNTER — Other Ambulatory Visit: Payer: Self-pay | Admitting: *Deleted

## 2023-05-29 MED ORDER — AZITHROMYCIN 500 MG PO TABS: 1000.0000 mg | ORAL_TABLET | Freq: Once | ORAL | 0 refills | Status: AC

## 2023-06-03 ENCOUNTER — Encounter: Payer: Self-pay | Admitting: Nurse Practitioner

## 2023-06-03 ENCOUNTER — Ambulatory Visit (INDEPENDENT_AMBULATORY_CARE_PROVIDER_SITE_OTHER): Payer: BC Managed Care – PPO | Admitting: Nurse Practitioner

## 2023-06-03 VITALS — BP 98/57 | HR 84 | Temp 97.4°F | Ht 64.0 in | Wt 127.4 lb

## 2023-06-03 DIAGNOSIS — K51911 Ulcerative colitis, unspecified with rectal bleeding: Secondary | ICD-10-CM | POA: Diagnosis not present

## 2023-06-03 DIAGNOSIS — F321 Major depressive disorder, single episode, moderate: Secondary | ICD-10-CM

## 2023-06-03 DIAGNOSIS — D500 Iron deficiency anemia secondary to blood loss (chronic): Secondary | ICD-10-CM | POA: Diagnosis not present

## 2023-06-03 DIAGNOSIS — F32A Depression, unspecified: Secondary | ICD-10-CM | POA: Insufficient documentation

## 2023-06-03 NOTE — Assessment & Plan Note (Signed)
Lab Results  Component Value Date   WBC 5.3 12/20/2022   HGB 11.5 (L) 12/20/2022   HCT 36.8 12/20/2022   MCV 81.4 12/20/2022   PLT 249 12/20/2022  Will check her CBC and iron panel today Has received 1 iron infusion

## 2023-06-03 NOTE — Assessment & Plan Note (Signed)
She currently denies bleeding Follows by GI at [facility] physician Continue hyrimoz 40 mg injection every 14 days And maintain close follow-up with GI Follow-up in 6 months

## 2023-06-03 NOTE — Assessment & Plan Note (Signed)
Flowsheet Row Office Visit from 06/03/2023 in [facility]  PHQ-9 Total Score 13     She denies SI, HI She is currently seeing a counselor Does not want medication Patient encouraged to maintain close follow-up with her counselor call the office if she decides to try medication

## 2023-06-03 NOTE — Patient Instructions (Signed)
1. Iron deficiency anemia due to chronic blood loss  - CBC with Differential/Platelet - Iron, TIBC and Ferritin Panel    It is important that you exercise regularly at least 30 minutes 5 times a week as tolerated  Think about what you will eat, plan ahead. Choose " clean, green, fresh or frozen" over canned, processed or packaged foods which are more sugary, salty and fatty. 70 to 75% of food eaten should be vegetables and fruit. Three meals at set times with snacks allowed between meals, but they must be fruit or vegetables. Aim to eat over a 12 hour period , example 7 am to 7 pm, and STOP after  your last meal of the day. Drink water,generally about 64 ounces per day, no other drink is as healthy. Fruit juice is best enjoyed in a healthy way, by EATING the fruit.  Thanks for choosing [facility] we consider it a privelige to serve you.

## 2023-06-03 NOTE — Progress Notes (Signed)
Established Patient Office Visit  Subjective:  Patient ID: Mackenzie Rios, female    DOB: 23-Mar-2001  Age: 22 y.o. MRN: [id number]  CC:  Chief Complaint  Patient presents with   Follow-up    HPI Mackenzie Rios is a 22 y.o. female  has a past medical history of Anemia, Chlamydia infection, and Colitis.  Patient presents for follow-up for her chronic medical conditions  Iron deficiency anemia.  She was referred to hematology received iron transfusion once, she did not follow-up with them after her first transfusion.  Stated that she is doing fine she denies abnormal bleeding.  Was intolerant to iron pills in the past  Ulcerative colitis.  Followed by GI at [facility] physician.  Takes hyrimoz 40 mg injection every 14 days.  Normal taking Humira and prednisone.  She denies abdominal pain, nausea, vomiting, rectal bleed.  Patient denies adverse reactions to current medications   Past Medical History:  Diagnosis Date   Anemia    Chlamydia infection    Colitis     Past Surgical History:  Procedure Laterality Date   BIOPSY  11/10/2021   Procedure: BIOPSY;  Surgeon: Willis Modena, MD;  Location: [facility];  Service: Endoscopy;;   FEMUR CLOSED REDUCTION     FEMUR SURGERY Left 2019   FLEXIBLE SIGMOIDOSCOPY N/A 11/10/2021   Procedure: FLEXIBLE SIGMOIDOSCOPY;  Surgeon: Willis Modena, MD;  Location: [facility];  Service: Endoscopy;  Laterality: N/A;    Family History  Problem Relation Age of Onset   Cancer - Colon Neg Hx     Social History   Socioeconomic History   Marital status: Single    Spouse name: Not on file   Number of children: 0   Years of education: Not on file   Highest education level: Some college, no degree  Occupational History   Not on file  Tobacco Use   Smoking status: Never   Smokeless tobacco: Never  Vaping Use   Vaping Use: Never used  Substance and Sexual Activity   Alcohol use: Yes    Comment: occassional   Drug use: Yes    Types:  Marijuana   Sexual activity: Yes    Birth control/protection: None  Other Topics Concern   Not on file  Social History Narrative   Lives by herself   Social Determinants of Health   Financial Resource Strain: Medium Risk (03/10/2023)   Overall Financial Resource Strain (CARDIA)    Difficulty of Paying Living Expenses: Somewhat hard  Food Insecurity: No Food Insecurity (03/10/2023)   Hunger Vital Sign    Worried About Running Out of Food in the Last Year: Never true    Ran Out of Food in the Last Year: Never true  Transportation Needs: No Transportation Needs (03/10/2023)   PRAPARE - Administrator, Civil Service (Medical): No    Lack of Transportation (Non-Medical): No  Physical Activity: Unknown (03/10/2023)   Exercise Vital Sign    Days of Exercise per Week: 0 days    Minutes of Exercise per Session: Not on file  Stress: Stress Concern Present (03/10/2023)   [employer] of Occupational Health - Occupational Stress Questionnaire    Feeling of Stress : Very much  Social Connections: Socially Isolated (03/10/2023)   Social Connection and Isolation Panel [NHANES]    Frequency of Communication with Friends and Family: More than three times a week    Frequency of Social Gatherings with Friends and Family: Three times a week  Attends Religious Services: Never    Active Member of Clubs or Organizations: No    Attends Banker Meetings: Not on file    Marital Status: Never married  Intimate Partner Violence: Not At Risk (10/29/2022)   Humiliation, Afraid, Rape, and Kick questionnaire    Fear of Current or Ex-Partner: No    Emotionally Abused: No    Physically Abused: No    Sexually Abused: No    Outpatient Medications Prior to Visit  Medication Sig Dispense Refill   HYRIMOZ 40 MG/0.4ML SOAJ Inject 40 mg into the skin every 14 (fourteen) days.     adalimumab (HUMIRA) 40 MG/0.4ML pen inject 40 mg Subcutaneous every other week for 30 days     ferrous sulfate  325 (65 FE) MG tablet Take 1 tablet (325 mg total) by mouth daily with breakfast. (Patient not taking: Reported on 06/03/2023) 90 tablet 0   fluconazole (DIFLUCAN) 150 MG tablet TAKE 1 TABLET (150 MG TOTAL) BY MOUTH DAILY. REPEAT IN 24 HOURS IF NEEDED (Patient not taking: Reported on 06/03/2023) 2 tablet 2   predniSONE (DELTASONE) 10 MG tablet Take 40 mg PO daily x 14 days, then 30 mg PO daily x 14 days, then 20 mg PO daily until seen in the office. (Patient not taking: Reported on 06/03/2023) 140 tablet 0   No facility-administered medications prior to visit.    No Known Allergies  ROS Review of Systems  Constitutional:  Negative for activity change, appetite change, chills, fatigue and fever.  HENT:  Negative for congestion, dental problem, ear discharge, ear pain, hearing loss, rhinorrhea, sinus pressure, sinus pain, sneezing and sore throat.   Eyes:  Negative for pain, discharge, redness and itching.  Respiratory:  Negative for cough, chest tightness, shortness of breath and wheezing.   Cardiovascular:  Negative for chest pain, palpitations and leg swelling.  Gastrointestinal:  Negative for abdominal distention, abdominal pain, anal bleeding, blood in stool, constipation, diarrhea, nausea, rectal pain and vomiting.  Endocrine: Negative for cold intolerance, heat intolerance, polydipsia, polyphagia and polyuria.  Genitourinary:  Negative for difficulty urinating, dysuria, flank pain, frequency, hematuria, menstrual problem, pelvic pain and vaginal bleeding.  Musculoskeletal:  Negative for arthralgias, back pain, gait problem, joint swelling and myalgias.  Skin:  Negative for color change, pallor, rash and wound.  Allergic/Immunologic: Negative for environmental allergies, food allergies and immunocompromised state.  Neurological:  Negative for dizziness, tremors, facial asymmetry, weakness and headaches.  Hematological:  Negative for adenopathy. Does not bruise/bleed easily.   Psychiatric/Behavioral:  Negative for agitation, behavioral problems, confusion, decreased concentration, hallucinations, self-injury and suicidal ideas.       Objective:    Physical Exam Vitals and nursing note reviewed.  Constitutional:      General: She is not in acute distress.    Appearance: Normal appearance. She is not ill-appearing, toxic-appearing or diaphoretic.  HENT:     Mouth/Throat:     Mouth: Mucous membranes are moist.     Pharynx: Oropharynx is clear. No oropharyngeal exudate or posterior oropharyngeal erythema.  Eyes:     General: No scleral icterus.       Right eye: No discharge.        Left eye: No discharge.     Extraocular Movements: Extraocular movements intact.     Conjunctiva/sclera: Conjunctivae normal.  Cardiovascular:     Rate and Rhythm: Normal rate and regular rhythm.     Pulses: Normal pulses.     Heart sounds: Normal heart sounds. No murmur  heard.    No friction rub. No gallop.  Pulmonary:     Effort: Pulmonary effort is normal. No respiratory distress.     Breath sounds: Normal breath sounds. No stridor. No wheezing, rhonchi or rales.  Chest:     Chest wall: No tenderness.  Abdominal:     General: There is no distension.     Palpations: Abdomen is soft.     Tenderness: There is no abdominal tenderness. There is no right CVA tenderness, left CVA tenderness or guarding.  Musculoskeletal:        General: No swelling, tenderness, deformity or signs of injury.     Right lower leg: No edema.     Left lower leg: No edema.  Skin:    General: Skin is warm and dry.     Capillary Refill: Capillary refill takes 2 to 3 seconds.     Coloration: Skin is not jaundiced or pale.     Findings: No bruising, erythema or lesion.  Neurological:     Mental Status: She is alert and oriented to person, place, and time.     Motor: No weakness.     Coordination: Coordination normal.     Gait: Gait normal.  Psychiatric:        Mood and Affect: Mood normal.         Behavior: Behavior normal.        Thought Content: Thought content normal.        Judgment: Judgment normal.     BP (!) 98/57   Pulse 84   Temp (!) 97.4 F (36.3 C)   Ht 5\' 4"  (1.626 m)   Wt 127 lb 6.4 oz (57.8 kg)   LMP 06/02/2023 (Approximate)   SpO2 100%   BMI 21.87 kg/m  Wt Readings from Last 3 Encounters:  06/03/23 127 lb 6.4 oz (57.8 kg)  12/20/22 146 lb 5 oz (66.4 kg)  11/30/22 146 lb 3.2 oz (66.3 kg)    Lab Results  Component Value Date   TSH 0.517 11/08/2021   Lab Results  Component Value Date   WBC 5.3 12/20/2022   HGB 11.5 (L) 12/20/2022   HCT 36.8 12/20/2022   MCV 81.4 12/20/2022   PLT 249 12/20/2022   Lab Results  Component Value Date   NA 141 10/31/2022   K 4.7 10/31/2022   CO2 25 10/31/2022   GLUCOSE 104 (H) 10/31/2022   BUN 11 10/31/2022   CREATININE 0.74 10/31/2022   BILITOT 0.7 10/31/2022   ALKPHOS 53 10/31/2022   AST 11 (L) 10/31/2022   ALT 10 10/31/2022   PROT 7.1 10/31/2022   ALBUMIN 3.3 (L) 10/31/2022   CALCIUM 9.1 10/31/2022   ANIONGAP 6 10/31/2022   No results found for: "CHOL" No results found for: "HDL" No results found for: "LDLCALC" No results found for: "TRIG" No results found for: "CHOLHDL" No results found for: "HGBA1C"    Assessment & Plan:   Problem List Items Addressed This Visit       Digestive   Ulcerative colitis with rectal bleeding (HCC)    She currently denies bleeding Follows by GI at [facility] physician Continue hyrimoz 40 mg injection every 14 days And maintain close follow-up with GI Follow-up in 6 months         Other   IDA (iron deficiency anemia) - Primary    Lab Results  Component Value Date   WBC 5.3 12/20/2022   HGB 11.5 (L) 12/20/2022   HCT 36.8 12/20/2022  MCV 81.4 12/20/2022   PLT 249 12/20/2022  Will check her CBC and iron panel today Has received 1 iron infusion      Relevant Orders   CBC with Differential/Platelet   Iron, TIBC and Ferritin Panel   Depression     Flowsheet Row Office Visit from 06/03/2023 in [facility]  PHQ-9 Total Score 13     She denies SI, HI She is currently seeing a counselor Does not want medication Patient encouraged to maintain close follow-up with her counselor call the office if she decides to try medication       No orders of the defined types were placed in this encounter.   Follow-up: Return in about 6 months (around 12/04/2023) for collitis, anemia.    Donell Beers, FNP

## 2023-06-04 LAB — CBC WITH DIFFERENTIAL/PLATELET
Basophils Absolute: 0.1 10*3/uL (ref 0.0–0.2)
Basos: 1 %
EOS (ABSOLUTE): 0.1 10*3/uL (ref 0.0–0.4)
Eos: 2 %
Hematocrit: 40.4 % (ref 34.0–46.6)
Hemoglobin: 12.7 g/dL (ref 11.1–15.9)
Immature Grans (Abs): 0 10*3/uL (ref 0.0–0.1)
Immature Granulocytes: 0 %
Lymphocytes Absolute: 1.9 10*3/uL (ref 0.7–3.1)
Lymphs: 46 %
MCH: 27.1 pg (ref 26.6–33.0)
MCHC: 31.4 g/dL — ABNORMAL LOW (ref 31.5–35.7)
MCV: 86 fL (ref 79–97)
Monocytes Absolute: 0.4 10*3/uL (ref 0.1–0.9)
Monocytes: 10 %
Neutrophils Absolute: 1.7 10*3/uL (ref 1.4–7.0)
Neutrophils: 41 %
Platelets: 182 10*3/uL (ref 150–450)
RBC: 4.69 x10E6/uL (ref 3.77–5.28)
RDW: 14.1 % (ref 11.7–15.4)
WBC: 4.1 10*3/uL (ref 3.4–10.8)

## 2023-06-04 LAB — IRON,TIBC AND FERRITIN PANEL
Ferritin: 30 ng/mL (ref 15–150)
Iron Saturation: 35 % (ref 15–55)
Iron: 114 ug/dL (ref 27–159)
Total Iron Binding Capacity: 329 ug/dL (ref 250–450)
UIBC: 215 ug/dL (ref 131–425)

## 2023-07-02 ENCOUNTER — Telehealth: Payer: Self-pay

## 2023-07-02 NOTE — Telephone Encounter (Signed)
Attempted to reach patient regarding message left with answering service Left voicemail for patient to call office or send mychart message with details regarding appointment needs.   

## 2023-07-03 ENCOUNTER — Encounter: Payer: Self-pay | Admitting: Hematology and Oncology

## 2023-07-03 NOTE — Progress Notes (Signed)
This encounter was created in error - please disregard.

## 2023-07-17 ENCOUNTER — Ambulatory Visit (INDEPENDENT_AMBULATORY_CARE_PROVIDER_SITE_OTHER): Payer: BC Managed Care – PPO

## 2023-07-17 ENCOUNTER — Other Ambulatory Visit (HOSPITAL_COMMUNITY)
Admission: RE | Admit: 2023-07-17 | Discharge: 2023-07-17 | Disposition: A | Discharge status: Home / Self Care | Payer: BC Managed Care – PPO | Source: Ambulatory Visit | Attending: Family Medicine | Admitting: Family Medicine

## 2023-07-17 ENCOUNTER — Encounter: Payer: Self-pay | Admitting: Hematology and Oncology

## 2023-07-17 VITALS — BP 123/86 | HR 80

## 2023-07-17 DIAGNOSIS — A749 Chlamydial infection, unspecified: Secondary | ICD-10-CM | POA: Diagnosis not present

## 2023-07-17 DIAGNOSIS — N760 Acute vaginitis: Secondary | ICD-10-CM

## 2023-07-17 DIAGNOSIS — B3731 Acute candidiasis of vulva and vagina: Secondary | ICD-10-CM

## 2023-07-17 LAB — OB RESULTS CONSOLE GC/CHLAMYDIA: Chlamydia: NEGATIVE

## 2023-07-17 NOTE — Progress Notes (Signed)
SUBJECTIVE:  22 y.o. female who desires a STI screen. Does note vaginal discharge, odor however denies bleeding or significant pelvic pain. No UTI symptoms. Denies history of known exposure to STD.  No LMP recorded.  OBJECTIVE:  She appears well.   ASSESSMENT:  STI Screen   PLAN:  Pt offered STI blood screening-not indicated GC, chlamydia, and trichomonas, BV and yeast probe sent to lab.  Treatment: To be determined once lab results are received.  Pt follow up as needed.

## 2023-07-18 LAB — CERVICOVAGINAL ANCILLARY ONLY
Bacterial Vaginitis (gardnerella): POSITIVE — AB
Candida Glabrata: NEGATIVE
Candida Vaginitis: POSITIVE — AB
Chlamydia: NEGATIVE
Comment: NEGATIVE
Comment: NEGATIVE
Comment: NEGATIVE
Comment: NEGATIVE
Comment: NEGATIVE
Comment: NORMAL
Neisseria Gonorrhea: NEGATIVE
Trichomonas: NEGATIVE

## 2023-07-19 MED ORDER — METRONIDAZOLE 500 MG PO TABS
500.0000 mg | ORAL_TABLET | Freq: Two times a day (BID) | ORAL | 0 refills | Status: AC
Start: 2023-07-19 — End: 2023-07-26

## 2023-07-19 MED ORDER — FLUCONAZOLE 150 MG PO TABS: 150.0000 mg | ORAL_TABLET | Freq: Every day | ORAL | 2 refills | Status: DC

## 2023-07-19 NOTE — Addendum Note (Signed)
Addended by: Reva Bores on: 07/19/2023 08:31 AM   Modules accepted: Orders

## 2023-08-07 ENCOUNTER — Other Ambulatory Visit: Payer: Self-pay

## 2023-08-09 ENCOUNTER — Ambulatory Visit: Payer: BC Managed Care – PPO | Admitting: Family Medicine

## 2023-08-12 ENCOUNTER — Encounter: Payer: Self-pay | Admitting: Gastroenterology

## 2023-08-12 ENCOUNTER — Ambulatory Visit: Payer: BC Managed Care – PPO | Admitting: Gastroenterology

## 2023-08-19 ENCOUNTER — Encounter: Payer: Self-pay | Admitting: Hematology and Oncology

## 2023-08-21 ENCOUNTER — Ambulatory Visit: Payer: Self-pay | Admitting: Nurse Practitioner

## 2023-08-23 ENCOUNTER — Ambulatory Visit (INDEPENDENT_AMBULATORY_CARE_PROVIDER_SITE_OTHER): Payer: Medicaid Other | Admitting: Nurse Practitioner

## 2023-08-23 ENCOUNTER — Encounter: Payer: Self-pay | Admitting: Nurse Practitioner

## 2023-08-23 VITALS — BP 111/71 | HR 85 | Temp 98.5°F | Ht 64.0 in | Wt 127.8 lb

## 2023-08-23 DIAGNOSIS — Z1329 Encounter for screening for other suspected endocrine disorder: Secondary | ICD-10-CM

## 2023-08-23 DIAGNOSIS — F321 Major depressive disorder, single episode, moderate: Secondary | ICD-10-CM | POA: Diagnosis not present

## 2023-08-23 DIAGNOSIS — F419 Anxiety disorder, unspecified: Secondary | ICD-10-CM | POA: Diagnosis not present

## 2023-08-23 MED ORDER — ESCITALOPRAM OXALATE 10 MG PO TABS: 10.0000 mg | ORAL_TABLET | Freq: Every day | ORAL | 1 refills | Status: DC

## 2023-08-23 MED ORDER — HYDROXYZINE PAMOATE 25 MG PO CAPS
25.0000 mg | ORAL_CAPSULE | Freq: Three times a day (TID) | ORAL | 0 refills | Status: AC | PRN
Start: 2023-08-23 — End: ?

## 2023-08-23 NOTE — Assessment & Plan Note (Addendum)
Flowsheet Row Office Visit from 08/23/2023 in [facility]  PHQ-9 Total Score 14     Start Lexapro 10 mg daily Side effects of medication reviewed Follow-up in 6 weeks List of therapist provided, information to give her [place] [facility] provided, encouraged to seek urgent care if needed Referral to psychiatrist placed Follow-up in 6 weeks Encouraged to maintain close follow-up with a therapist

## 2023-08-23 NOTE — Progress Notes (Signed)
Established Patient Office Visit  Subjective:  Patient ID: Mackenzie Rios, female    DOB: 2001/06/12  Age: 23 y.o. MRN: [id number]  CC: No chief complaint on file.   HPI Mackenzie Rios is a 22 y.o. female  has a past medical history of Anemia, Anxiety, Chlamydia infection, Colitis, and Depression.  Patient presents for follow-up for anxiety and depression  Anxiety and depression.  She has been attending therapy as peculiar counseling for the past 3 months, states that counseling is not helping.  She has never been on medication.  She sometimes feels like hurting herself but has no plan.  Currently denies SI, HI.  She is interested in starting medication.     Past Medical History:  Diagnosis Date   Anemia    Anxiety    Chlamydia infection    Colitis    Depression     Past Surgical History:  Procedure Laterality Date   BIOPSY  11/10/2021   Procedure: BIOPSY;  Surgeon: Willis Modena, MD;  Location: [facility];  Service: Endoscopy;;   FEMUR CLOSED REDUCTION     FEMUR SURGERY Left 2019   FLEXIBLE SIGMOIDOSCOPY N/A 11/10/2021   Procedure: FLEXIBLE SIGMOIDOSCOPY;  Surgeon: Willis Modena, MD;  Location: [facility];  Service: Endoscopy;  Laterality: N/A;    Family History  Problem Relation Age of Onset   Cancer - Colon Neg Hx     Social History   Socioeconomic History   Marital status: Single    Spouse name: Not on file   Number of children: 0   Years of education: Not on file   Highest education level: Some college, no degree  Occupational History   Not on file  Tobacco Use   Smoking status: Never   Smokeless tobacco: Never  Vaping Use   Vaping status: Never Used  Substance and Sexual Activity   Alcohol use: Yes    Comment: occassional   Drug use: Yes    Types: Marijuana   Sexual activity: Yes    Birth control/protection: None  Other Topics Concern   Not on file  Social History Narrative   Lives by herself   Social Determinants of Health    Financial Resource Strain: Medium Risk (03/10/2023)   Overall Financial Resource Strain (CARDIA)    Difficulty of Paying Living Expenses: Somewhat hard  Food Insecurity: No Food Insecurity (03/10/2023)   Hunger Vital Sign    Worried About Running Out of Food in the Last Year: Never true    Ran Out of Food in the Last Year: Never true  Transportation Needs: No Transportation Needs (03/10/2023)   PRAPARE - Administrator, Civil Service (Medical): No    Lack of Transportation (Non-Medical): No  Physical Activity: Unknown (03/10/2023)   Exercise Vital Sign    Days of Exercise per Week: 0 days    Minutes of Exercise per Session: Not on file  Stress: Stress Concern Present (03/10/2023)   [employer] of Occupational Health - Occupational Stress Questionnaire    Feeling of Stress : Very much  Social Connections: Socially Isolated (03/10/2023)   Social Connection and Isolation Panel [NHANES]    Frequency of Communication with Friends and Family: More than three times a week    Frequency of Social Gatherings with Friends and Family: Three times a week    Attends Religious Services: Never    Active Member of Clubs or Organizations: No    Attends Banker Meetings: Not on file  Marital Status: Never married  Intimate Partner Violence: Not At Risk (10/29/2022)   Humiliation, Afraid, Rape, and Kick questionnaire    Fear of Current or Ex-Partner: No    Emotionally Abused: No    Physically Abused: No    Sexually Abused: No    Outpatient Medications Prior to Visit  Medication Sig Dispense Refill   fluconazole (DIFLUCAN) 150 MG tablet Take 1 tablet (150 mg total) by mouth daily. Repeat in 24 hours if needed 2 tablet 2   HYRIMOZ 40 MG/0.4ML SOAJ Inject 40 mg into the skin every 14 (fourteen) days.     No facility-administered medications prior to visit.    No Known Allergies  ROS Review of Systems  Constitutional: Negative.   HENT: Negative.    Respiratory:  Negative.    Cardiovascular: Negative.   Gastrointestinal: Negative.   Genitourinary: Negative.   Musculoskeletal: Negative.   Skin: Negative.   Neurological: Negative.   Psychiatric/Behavioral: Negative.  Negative for confusion, self-injury and suicidal ideas.       Objective:    Physical Exam Vitals and nursing note reviewed.  Constitutional:      General: She is not in acute distress.    Appearance: Normal appearance. She is not ill-appearing, toxic-appearing or diaphoretic.  HENT:     Mouth/Throat:     Mouth: Mucous membranes are moist.     Pharynx: Oropharynx is clear. No oropharyngeal exudate or posterior oropharyngeal erythema.  Eyes:     General: No scleral icterus.       Right eye: No discharge.        Left eye: No discharge.     Extraocular Movements: Extraocular movements intact.     Conjunctiva/sclera: Conjunctivae normal.  Cardiovascular:     Rate and Rhythm: Normal rate and regular rhythm.     Pulses: Normal pulses.     Heart sounds: Normal heart sounds. No murmur heard.    No friction rub. No gallop.  Pulmonary:     Effort: Pulmonary effort is normal. No respiratory distress.     Breath sounds: Normal breath sounds. No stridor. No wheezing, rhonchi or rales.  Chest:     Chest wall: No tenderness.  Abdominal:     General: There is no distension.     Palpations: Abdomen is soft.     Tenderness: There is no abdominal tenderness. There is no right CVA tenderness, left CVA tenderness or guarding.  Musculoskeletal:        General: No swelling, tenderness, deformity or signs of injury.     Right lower leg: No edema.     Left lower leg: No edema.  Skin:    General: Skin is warm and dry.     Capillary Refill: Capillary refill takes less than 2 seconds.     Coloration: Skin is not jaundiced or pale.     Findings: No bruising, erythema or lesion.  Neurological:     Mental Status: She is alert and oriented to person, place, and time.     Motor: No weakness.      Coordination: Coordination normal.     Gait: Gait normal.  Psychiatric:        Mood and Affect: Mood normal.        Behavior: Behavior normal.        Thought Content: Thought content normal.        Judgment: Judgment normal.     BP 111/71   Pulse 85   Temp 98.5 F (36.9 C) (Oral)  Ht 5\' 4"  (1.626 m)   Wt 127 lb 12.8 oz (58 kg)   LMP 07/28/2023 (Approximate)   SpO2 100%   BMI 21.94 kg/m  Wt Readings from Last 3 Encounters:  08/23/23 127 lb 12.8 oz (58 kg)  06/03/23 127 lb 6.4 oz (57.8 kg)  12/20/22 146 lb 5 oz (66.4 kg)    Lab Results  Component Value Date   TSH 0.517 11/08/2021   Lab Results  Component Value Date   WBC 4.1 06/03/2023   HGB 12.7 06/03/2023   HCT 40.4 06/03/2023   MCV 86 06/03/2023   PLT 182 06/03/2023   Lab Results  Component Value Date   NA 141 10/31/2022   K 4.7 10/31/2022   CO2 25 10/31/2022   GLUCOSE 104 (H) 10/31/2022   BUN 11 10/31/2022   CREATININE 0.74 10/31/2022   BILITOT 0.7 10/31/2022   ALKPHOS 53 10/31/2022   AST 11 (L) 10/31/2022   ALT 10 10/31/2022   PROT 7.1 10/31/2022   ALBUMIN 3.3 (L) 10/31/2022   CALCIUM 9.1 10/31/2022   ANIONGAP 6 10/31/2022   No results found for: "CHOL" No results found for: "HDL" No results found for: "LDLCALC" No results found for: "TRIG" No results found for: "CHOLHDL" No results found for: "HGBA1C"    Assessment & Plan:   Problem List Items Addressed This Visit       Other   Depression - Primary    Flowsheet Row Office Visit from 08/23/2023 in [facility]  PHQ-9 Total Score 14     Start Lexapro 10 mg daily Side effects of medication reviewed Follow-up in 6 weeks List of therapist provided, information to give her [place] [facility] provided, encouraged to seek urgent care if needed Referral to psychiatrist placed Follow-up in 6 weeks Encouraged to maintain close follow-up with a therapist      Relevant Medications   escitalopram (LEXAPRO) 10  MG tablet   hydrOXYzine (VISTARIL) 25 MG capsule   Other Relevant Orders   Ambulatory referral to Psychiatry   Anxiety       08/23/2023    8:23 AM 06/03/2023   11:13 AM  GAD 7 : Generalized Anxiety Score  Nervous, Anxious, on Edge 3 0  Control/stop worrying 3 3  Worry too much - different things 3 3  Trouble relaxing 1 2  Restless 1 0  Easily annoyed or irritable 3 3  Afraid - awful might happen 1 0  Total GAD 7 Score 15 11  Anxiety Difficulty Very difficult Somewhat difficult  Start Lexapro 10 mg daily Hydroxyzine 25 mg 3 times daily as needed Side effects of medications reviewed Follow-up in 6 weeks List of therapist provided, information to give her [place] [facility] provided, encouraged to seek urgent care if needed Referral to psychiatrist placed Follow-up in 6 weeks Encouraged to maintain close follow-up with a therapist        Relevant Medications   escitalopram (LEXAPRO) 10 MG tablet   hydrOXYzine (VISTARIL) 25 MG capsule   Other Relevant Orders   Ambulatory referral to Psychiatry   Other Visit Diagnoses     Screening for thyroid disorder       Relevant Orders   TSH       Meds ordered this encounter  Medications   escitalopram (LEXAPRO) 10 MG tablet    Sig: Take 1 tablet (10 mg total) by mouth daily.    Dispense:  60 tablet    Refill:  1  hydrOXYzine (VISTARIL) 25 MG capsule    Sig: Take 1 capsule (25 mg total) by mouth 3 (three) times daily as needed.    Dispense:  30 capsule    Refill:  0    Follow-up: Return in about 6 weeks (around 10/04/2023) for ANXIETY, DEPRESSION.    Donell Beers, FNP

## 2023-08-23 NOTE — Patient Instructions (Addendum)
1. Screening for thyroid disorder  - TSH  2. Anxiety  - Ambulatory referral to Psychiatry - hydrOXYzine (VISTARIL) 25 MG capsule; Take 1 capsule (25 mg total) by mouth 3 (three) times daily as needed.  Dispense: 30 capsule; Refill: 0  3. Current moderate episode of major depressive disorder, unspecified whether recurrent (HCC)  - escitalopram (LEXAPRO) 10 MG tablet; Take 1 tablet (10 mg total) by mouth daily.  Dispense: 60 tablet; Refill: 1 - Ambulatory referral to Psychiatry    It is important that you exercise regularly at least 30 minutes 5 times a week as tolerated  Think about what you will eat, plan ahead. Choose " clean, green, fresh or frozen" over canned, processed or packaged foods which are more sugary, salty and fatty. 70 to 75% of food eaten should be vegetables and fruit. Three meals at set times with snacks allowed between meals, but they must be fruit or vegetables. Aim to eat over a 12 hour period , example 7 am to 7 pm, and STOP after  your last meal of the day. Drink water,generally about 64 ounces per day, no other drink is as healthy. Fruit juice is best enjoyed in a healthy way, by EATING the fruit.  Thanks for choosing [facility] we consider it a privelige to serve you.

## 2023-08-23 NOTE — Assessment & Plan Note (Addendum)
    08/23/2023    8:23 AM 06/03/2023   11:13 AM  GAD 7 : Generalized Anxiety Score  Nervous, Anxious, on Edge 3 0  Control/stop worrying 3 3  Worry too much - different things 3 3  Trouble relaxing 1 2  Restless 1 0  Easily annoyed or irritable 3 3  Afraid - awful might happen 1 0  Total GAD 7 Score 15 11  Anxiety Difficulty Very difficult Somewhat difficult  Start Lexapro 10 mg daily Hydroxyzine 25 mg 3 times daily as needed Side effects of medications reviewed Follow-up in 6 weeks List of therapist provided, information to give her [place] [facility] provided, encouraged to seek urgent care if needed Referral to psychiatrist placed Follow-up in 6 weeks Encouraged to maintain close follow-up with a therapist

## 2023-08-23 NOTE — Progress Notes (Signed)
Patient states she feels like she needs to be on depression medication.

## 2023-08-24 LAB — TSH: TSH: 0.388 u[IU]/mL — ABNORMAL LOW (ref 0.450–4.500)

## 2023-08-26 ENCOUNTER — Other Ambulatory Visit: Payer: Self-pay | Admitting: Nurse Practitioner

## 2023-08-26 DIAGNOSIS — R7989 Other specified abnormal findings of blood chemistry: Secondary | ICD-10-CM

## 2023-08-28 ENCOUNTER — Encounter: Payer: Self-pay | Admitting: Hematology and Oncology

## 2023-10-04 ENCOUNTER — Ambulatory Visit: Payer: Medicaid Other | Admitting: Nurse Practitioner

## 2023-10-07 ENCOUNTER — Ambulatory Visit (HOSPITAL_COMMUNITY): Payer: Medicaid Other | Admitting: Mental Health

## 2023-10-07 ENCOUNTER — Encounter (HOSPITAL_COMMUNITY): Payer: Self-pay

## 2023-10-18 ENCOUNTER — Other Ambulatory Visit: Payer: Self-pay | Admitting: Nurse Practitioner

## 2023-10-18 DIAGNOSIS — F321 Major depressive disorder, single episode, moderate: Secondary | ICD-10-CM

## 2023-11-04 ENCOUNTER — Other Ambulatory Visit (HOSPITAL_COMMUNITY): Payer: Self-pay | Admitting: Nurse Practitioner

## 2023-11-04 DIAGNOSIS — K51911 Ulcerative colitis, unspecified with rectal bleeding: Secondary | ICD-10-CM

## 2023-11-12 ENCOUNTER — Encounter: Payer: Self-pay | Admitting: Hematology and Oncology

## 2023-11-13 ENCOUNTER — Encounter: Payer: Self-pay | Admitting: Nurse Practitioner

## 2023-11-13 ENCOUNTER — Telehealth (INDEPENDENT_AMBULATORY_CARE_PROVIDER_SITE_OTHER): Payer: PRIVATE HEALTH INSURANCE | Admitting: Nurse Practitioner

## 2023-11-13 DIAGNOSIS — K51911 Ulcerative colitis, unspecified with rectal bleeding: Secondary | ICD-10-CM | POA: Diagnosis not present

## 2023-11-13 DIAGNOSIS — F320 Major depressive disorder, single episode, mild: Secondary | ICD-10-CM | POA: Diagnosis not present

## 2023-11-13 DIAGNOSIS — F419 Anxiety disorder, unspecified: Secondary | ICD-10-CM

## 2023-11-13 NOTE — Assessment & Plan Note (Signed)
Controlled on Lexapro 10 mg daily Continue current medication

## 2023-11-13 NOTE — Telephone Encounter (Signed)
Pt had visit today.  KH

## 2023-11-13 NOTE — Assessment & Plan Note (Signed)
Continue prednisone 20mg  daily .  Referral sent to GI at [facility] CBC in one week

## 2023-11-13 NOTE — Progress Notes (Signed)
Virtual Visit via Telephone Note  I connected with Mackenzie Rios @ on 11/13/23 at  1:20 PM EST by telephone and verified that I am speaking with the correct person using two identifiers.  I spent 7 minutes on this telehealth encounter  Location: Patient: home Provider: office   I discussed the limitations, risks, security and privacy concerns of performing an evaluation and management service by telephone and the availability of in person appointments. I also discussed with the patient that there may be a patient responsible charge related to this service. The patient expressed understanding and agreed to proceed.   History of Present Illness: Patient presents with complaints of ulcerative colitis flareup, she was evaluated by a doctor at her work was prescribed prednisone 40 mg daily for 1 week which she has completed, she continues to have abdominal pain, rectal bleeding so they prescribed additional prednisone 20 mg daily for 30 days starting today.  They referred her to a GI specialist with [facility] in [place] but the appointment is in February 2024.  We discussed sending referral to [facility] in this area, patient agrees to the plan.  She stated that the previous GI specialist she was seen is not accepting her insurance. She denies fever chills nausea vomiting. Plans on restarting humria      Observations/Objective:   Assessment and Plan: Ulcerative colitis with rectal bleeding (HCC) Continue prednisone 20mg  daily .  Referral sent to GI at [facility] CBC in one week   Depression Controlled on Lexapro 10 mg daily Continue current medication  Anxiety Controlled on Lexapro 10 mg daily Continue current medication   Follow Up Instructions:    I discussed the assessment and treatment plan with the patient. The patient was provided an opportunity to ask questions and all were answered. The patient agreed with the plan and demonstrated an understanding of the instructions.    The patient was advised to call back or seek an in-person evaluation if the symptoms worsen or if the condition fails to improve as anticipated.

## 2023-11-18 ENCOUNTER — Ambulatory Visit: Payer: PRIVATE HEALTH INSURANCE | Admitting: Physician Assistant

## 2023-11-18 NOTE — Progress Notes (Unsigned)
Mackenzie Amy, PA-C [address]  [address]  [place], [place] [address]  Main: [phone]  Fax: [phone]   Gastroenterology Consultation  Referring Provider:     Donell Beers, FNP Primary Care Physician:  Donell Beers, FNP Primary Gastroenterologist:  *** Reason for Consultation:     Colitis flare        HPI:   Mackenzie Rios is a 22 y.o. y/o female referred for consultation & management  by Donell Beers, FNP.    Diagnosed with ulcerative colitis 11/2021.  Was hospitalized and treated with IV steroids.  Then oral prednisone taper.  Disease to severe for mesalamine.  Followed up with Dr. Miguel Aschoff outpatient and was started on Humira.  Last saw Dr. Dulce Sellar 02/2023 and was on Humira at that time.  Had flexible sigmoidoscopy by Dr. Dulce Sellar at [facility] 11/2021 to evaluate diarrhea and hematochezia.  Flex sig showed moderately severe ulcerative colitis involving rectum, rectosigmoid colon, sigmoid colon, and descending colon.  11/05/2023 labs: Hemoglobin 12.2, normal iron 88, iron saturation 27%, ferritin 12.  Normal vitamin B12 of 277.  Past Medical History:  Diagnosis Date   Anemia    Anxiety    Chlamydia infection    Closed displaced oblique fracture of shaft of left femur (HCC) 12/26/2017   Added automatically from request for surgery [id number]     Colitis    Depression    Left leg pain 12/27/2017    Past Surgical History:  Procedure Laterality Date   BIOPSY  11/10/2021   Procedure: BIOPSY;  Surgeon: Willis Modena, MD;  Location: [facility];  Service: Endoscopy;;   FEMUR CLOSED REDUCTION     FEMUR SURGERY Left 2019   FLEXIBLE SIGMOIDOSCOPY N/A 11/10/2021   Procedure: FLEXIBLE SIGMOIDOSCOPY;  Surgeon: Willis Modena, MD;  Location: [facility];  Service: Endoscopy;  Laterality: N/A;    Prior to Admission medications   Medication Sig Start Date End Date Taking? Authorizing Provider  escitalopram (LEXAPRO) 10 MG tablet TAKE 1 TABLET BY MOUTH  EVERY DAY 10/18/23   Paseda, Baird Kay, FNP  hydrOXYzine (VISTARIL) 25 MG capsule Take 1 capsule (25 mg total) by mouth 3 (three) times daily as needed. 08/23/23   Paseda, Baird Kay, FNP  HYRIMOZ 40 MG/0.4ML SOAJ Inject 40 mg into the skin every 14 (fourteen) days. Patient not taking: Reported on 11/13/2023 04/19/23   [provider]  predniSONE (DELTASONE) 10 MG tablet Take 1 tablet by mouth daily. 11/12/23   [provider]    Family History  Problem Relation Age of Onset   Cancer - Colon Neg Hx      Social History   Tobacco Use   Smoking status: Never   Smokeless tobacco: Never  Vaping Use   Vaping status: Never Used  Substance Use Topics   Alcohol use: Yes    Comment: occassional   Drug use: Yes    Types: Marijuana    Allergies as of 11/18/2023   (No Known Allergies)    Review of Systems:    All systems reviewed and negative except where noted in HPI.   Physical Exam:  There were no vitals taken for this visit. No LMP recorded.  General:   Alert,  Well-developed, well-nourished, pleasant and cooperative in NAD Lungs:  Respirations even and unlabored.  Clear throughout to auscultation.   No wheezes, crackles, or rhonchi. No acute distress. Heart:  Regular rate and rhythm; no murmurs, clicks, rubs, or gallops. Abdomen:  Normal bowel  sounds.  No bruits.  Soft, and non-distended without masses, hepatosplenomegaly or hernias noted.  No Tenderness.  No guarding or rebound tenderness.    Neurologic:  Alert and oriented x3;  grossly normal neurologically. Psych:  Alert and cooperative. Normal mood and affect.  Imaging Studies: No results found.  Assessment and Plan:   Mackenzie Rios is a 22 y.o. y/o female has been referred for    Moderately severe ulcerative colitis  Follow up ***  Mackenzie Amy, PA-C    BP check ***

## 2023-11-21 ENCOUNTER — Other Ambulatory Visit: Payer: Self-pay

## 2023-11-29 ENCOUNTER — Other Ambulatory Visit: Payer: Self-pay | Admitting: Family Medicine

## 2023-11-29 DIAGNOSIS — B3731 Acute candidiasis of vulva and vagina: Secondary | ICD-10-CM

## 2023-12-02 ENCOUNTER — Other Ambulatory Visit: Payer: PRIVATE HEALTH INSURANCE

## 2023-12-02 DIAGNOSIS — K51911 Ulcerative colitis, unspecified with rectal bleeding: Secondary | ICD-10-CM

## 2023-12-02 DIAGNOSIS — R7989 Other specified abnormal findings of blood chemistry: Secondary | ICD-10-CM

## 2023-12-03 LAB — CBC
Hematocrit: 38.1 % (ref 34.0–46.6)
Hemoglobin: 11.9 g/dL (ref 11.1–15.9)
MCH: 27.9 pg (ref 26.6–33.0)
MCHC: 31.2 g/dL — ABNORMAL LOW (ref 31.5–35.7)
MCV: 89 fL (ref 79–97)
Platelets: 245 10*3/uL (ref 150–450)
RBC: 4.26 x10E6/uL (ref 3.77–5.28)
RDW: 12.9 % (ref 11.7–15.4)
WBC: 10.8 10*3/uL (ref 3.4–10.8)

## 2023-12-03 LAB — TSH+FREE T4
Free T4: 1.08 ng/dL (ref 0.82–1.77)
TSH: 0.201 u[IU]/mL — ABNORMAL LOW (ref 0.450–4.500)

## 2023-12-06 ENCOUNTER — Ambulatory Visit: Payer: Self-pay | Admitting: Nurse Practitioner

## 2023-12-16 ENCOUNTER — Ambulatory Visit: Payer: PRIVATE HEALTH INSURANCE | Admitting: Physician Assistant

## 2023-12-18 ENCOUNTER — Encounter: Payer: Self-pay | Admitting: Hematology and Oncology

## 2023-12-18 ENCOUNTER — Other Ambulatory Visit: Payer: Self-pay | Admitting: Nurse Practitioner

## 2023-12-18 ENCOUNTER — Other Ambulatory Visit (HOSPITAL_BASED_OUTPATIENT_CLINIC_OR_DEPARTMENT_OTHER): Payer: Self-pay

## 2023-12-18 MED ORDER — PREDNISONE 10 MG PO TABS
10.0000 mg | ORAL_TABLET | Freq: Every day | ORAL | 0 refills | Status: DC
  Filled 2023-12-18: qty 30, 30d supply, fill #0

## 2023-12-19 ENCOUNTER — Telehealth: Payer: Self-pay | Admitting: Nurse Practitioner

## 2023-12-19 NOTE — Telephone Encounter (Signed)
Copied from CRM [id number]. Topic: Clinical - Lab/Test Results >> Dec 19, 2023  8:08 AM Carlatta H wrote: Reason for CRM: Patient received a call from Kimberly//Please call back if not answer leave a detailed message

## 2023-12-30 ENCOUNTER — Other Ambulatory Visit (HOSPITAL_BASED_OUTPATIENT_CLINIC_OR_DEPARTMENT_OTHER): Payer: Self-pay

## 2024-01-03 ENCOUNTER — Encounter: Payer: Self-pay | Admitting: Hematology and Oncology

## 2024-01-06 ENCOUNTER — Encounter: Payer: Self-pay | Admitting: Hematology and Oncology

## 2024-01-06 ENCOUNTER — Ambulatory Visit: Payer: Self-pay | Admitting: Nurse Practitioner

## 2024-01-06 ENCOUNTER — Ambulatory Visit: Payer: Medicaid Other | Admitting: Physician Assistant

## 2024-01-06 ENCOUNTER — Encounter: Payer: Self-pay | Admitting: Gastroenterology

## 2024-01-30 ENCOUNTER — Ambulatory Visit: Payer: Managed Care, Other (non HMO) | Admitting: Obstetrics and Gynecology

## 2024-02-12 ENCOUNTER — Ambulatory Visit: Payer: Self-pay | Admitting: Nurse Practitioner

## 2024-02-12 ENCOUNTER — Ambulatory Visit: Payer: Medicaid Other | Admitting: Gastroenterology

## 2024-02-19 ENCOUNTER — Encounter: Payer: Self-pay | Admitting: Hematology and Oncology

## 2024-03-31 NOTE — Progress Notes (Deleted)
 Chief Complaint: Primary GI MD:  HPI:  *** is a  ***  who was referred to me by Paseda, Folashade R, FNP for a complaint of *** .     Discussed the use of AI scribe software for clinical note transcription with the patient, who gave verbal consent to proceed.  History of Present Illness      PREVIOUS GI WORKUP     Past Medical History:  Diagnosis Date   Anemia    Anxiety    Chlamydia infection    Closed displaced oblique fracture of shaft of left femur (HCC) 12/26/2017   Added automatically from request for surgery [id number]     Colitis    Depression    Left leg pain 12/27/2017    Past Surgical History:  Procedure Laterality Date   BIOPSY  11/10/2021   Procedure: BIOPSY;  Surgeon: Evangeline Hilts, MD;  Location: [facility];  Service: Endoscopy;;   FEMUR CLOSED REDUCTION     FEMUR SURGERY Left 2019   FLEXIBLE SIGMOIDOSCOPY N/A 11/10/2021   Procedure: FLEXIBLE SIGMOIDOSCOPY;  Surgeon: Evangeline Hilts, MD;  Location: [facility];  Service: Endoscopy;  Laterality: N/A;    Current Outpatient Medications  Medication Sig Dispense Refill   escitalopram  (LEXAPRO ) 10 MG tablet TAKE 1 TABLET BY MOUTH EVERY DAY 90 tablet 2   hydrOXYzine  (VISTARIL ) 25 MG capsule Take 1 capsule (25 mg total) by mouth 3 (three) times daily as needed. 30 capsule 0   HYRIMOZ 40 MG/0.4ML SOAJ Inject 40 mg into the skin every 14 (fourteen) days. (Patient not taking: Reported on 11/13/2023)     predniSONE  (DELTASONE ) 10 MG tablet Take 1 tablet (10 mg total) by mouth daily. 30 tablet 0   No current facility-administered medications for this visit.    Allergies as of 04/01/2024   (No Known Allergies)    Family History  Problem Relation Age of Onset   Cancer - Colon Neg Hx     Social History   Socioeconomic History   Marital status: Single    Spouse name: Not on file   Number of children: 0   Years of education: Not on file   Highest education level: Some college, no degree   Occupational History   Not on file  Tobacco Use   Smoking status: Never   Smokeless tobacco: Never  Vaping Use   Vaping status: Never Used  Substance and Sexual Activity   Alcohol use: Yes    Comment: occassional   Drug use: Yes    Types: Marijuana   Sexual activity: Yes    Birth control/protection: None  Other Topics Concern   Not on file  Social History Narrative   Lives by herself   Social Drivers of Health   Financial Resource Strain: Medium Risk (03/10/2023)   Overall Financial Resource Strain (CARDIA)    Difficulty of Paying Living Expenses: Somewhat hard  Food Insecurity: Low Risk  (11/05/2023)   Received from [facility]   Hunger Vital Sign    Worried About Running Out of Food in the Last Year: Never true    Ran Out of Food in the Last Year: Never true  Transportation Needs: No Transportation Needs (11/05/2023)   Received from [facility]    In the past 12 months, has lack of reliable transportation kept you from medical appointments, meetings, work or from getting things needed for daily living? : No  Physical Activity: Unknown (03/10/2023)   Exercise Vital Sign  Days of Exercise per Week: 0 days    Minutes of Exercise per Session: Not on file  Stress: Stress Concern Present (03/10/2023)   [employer] of Occupational Health - Occupational Stress Questionnaire    Feeling of Stress : Very much  Social Connections: Socially Isolated (03/10/2023)   Social Connection and Isolation Panel [NHANES]    Frequency of Communication with Friends and Family: More than three times a week    Frequency of Social Gatherings with Friends and Family: Three times a week    Attends Religious Services: Never    Active Member of Clubs or Organizations: No    Attends Banker Meetings: Not on file    Marital Status: Never married  Intimate Partner Violence: Not At Risk (10/29/2022)   Humiliation, Afraid, Rape, and Kick questionnaire    Fear of  Current or Ex-Partner: No    Emotionally Abused: No    Physically Abused: No    Sexually Abused: No    Review of Systems:    Constitutional: No weight loss, fever, chills, weakness or fatigue HEENT: Eyes: No change in vision               Ears, Nose, Throat:  No change in hearing or congestion Skin: No rash or itching Cardiovascular: No chest pain, chest pressure or palpitations   Respiratory: No SOB or cough Gastrointestinal: See HPI and otherwise negative Genitourinary: No dysuria or change in urinary frequency Neurological: No headache, dizziness or syncope Musculoskeletal: No new muscle or joint pain Hematologic: No bleeding or bruising Psychiatric: No history of depression or anxiety    Physical Exam:  Vital signs: There were no vitals taken for this visit.  Constitutional: NAD, alert and cooperative Head:  Normocephalic and atraumatic. Eyes:   PEERL, EOMI. No icterus. Conjunctiva pink. Respiratory: Respirations even and unlabored. Lungs clear to auscultation bilaterally.   No wheezes, crackles, or rhonchi.  Cardiovascular:  Regular rate and rhythm. No peripheral edema, cyanosis or pallor.  Gastrointestinal:  Soft, nondistended, nontender. No rebound or guarding. Normal bowel sounds. No appreciable masses or hepatomegaly. Rectal:  Declines Msk:  Symmetrical without gross deformities. Without edema, no deformity or joint abnormality.  Neurologic:  Alert and  oriented x4;  grossly normal neurologically.  Skin:   Dry and intact without significant lesions or rashes. Psychiatric: Oriented to person, place and time. Demonstrates good judgement and reason without abnormal affect or behaviors.  Physical Exam    RELEVANT LABS AND IMAGING: CBC    Component Value Date/Time   WBC 10.8 12/02/2023 0834   WBC 5.3 12/20/2022 1611   WBC 17.6 (H) 10/31/2022 0452   RBC 4.26 12/02/2023 0834   RBC 4.52 12/20/2022 1611   HGB 11.9 12/02/2023 0834   HCT 38.1 12/02/2023 0834   PLT  245 12/02/2023 0834   MCV 89 12/02/2023 0834   MCH 27.9 12/02/2023 0834   MCH 25.4 (L) 12/20/2022 1611   MCHC 31.2 (L) 12/02/2023 0834   MCHC 31.3 12/20/2022 1611   RDW 12.9 12/02/2023 0834   LYMPHSABS 1.9 06/03/2023 1154   MONOABS 0.8 12/20/2022 1611   EOSABS 0.1 06/03/2023 1154   BASOSABS 0.1 06/03/2023 1154    CMP     Component Value Date/Time   NA 141 10/31/2022 0452   K 4.7 10/31/2022 0452   CL 110 10/31/2022 0452   CO2 25 10/31/2022 0452   GLUCOSE 104 (H) 10/31/2022 0452   BUN 11 10/31/2022 0452   CREATININE 0.74 10/31/2022 0452  CALCIUM 9.1 10/31/2022 0452   PROT 7.1 10/31/2022 0452   ALBUMIN 3.3 (L) 10/31/2022 0452   AST 11 (L) 10/31/2022 0452   ALT 10 10/31/2022 0452   ALKPHOS 53 10/31/2022 0452   BILITOT 0.7 10/31/2022 0452   GFRNONAA >60 10/31/2022 0452     Assessment/Plan:   Assessment and Plan Assessment & Plan        [facility] 03/31/2024, 9:43 AM  Cc: Paseda, Folashade R, FNP

## 2024-04-01 ENCOUNTER — Ambulatory Visit: Admitting: Gastroenterology

## 2024-08-10 ENCOUNTER — Encounter: Payer: Self-pay | Admitting: Hematology and Oncology

## 2024-08-11 ENCOUNTER — Telehealth (INDEPENDENT_AMBULATORY_CARE_PROVIDER_SITE_OTHER): Admitting: Nurse Practitioner

## 2024-08-11 DIAGNOSIS — F419 Anxiety disorder, unspecified: Secondary | ICD-10-CM

## 2024-08-11 NOTE — Progress Notes (Signed)
 No show

## 2024-08-14 ENCOUNTER — Encounter: Payer: Self-pay | Admitting: Nurse Practitioner

## 2024-08-14 ENCOUNTER — Telehealth: Admitting: Nurse Practitioner

## 2024-08-14 ENCOUNTER — Ambulatory Visit: Admitting: Nurse Practitioner

## 2024-08-14 ENCOUNTER — Other Ambulatory Visit: Payer: Self-pay | Admitting: Nurse Practitioner

## 2024-08-14 DIAGNOSIS — K51911 Ulcerative colitis, unspecified with rectal bleeding: Secondary | ICD-10-CM | POA: Diagnosis not present

## 2024-08-14 MED ORDER — PREDNISONE 20 MG PO TABS: ORAL_TABLET | ORAL | 0 refills | Status: AC

## 2024-08-14 MED ORDER — PREDNISONE 5 MG PO TABS: 5.0000 mg | ORAL_TABLET | Freq: Every day | ORAL | 0 refills | Status: DC

## 2024-08-14 NOTE — Progress Notes (Signed)
 Virtual Visit via video Note  I connected with Mackenzie Rios @ on 08/14/24 at 11:00 AM EDT by video and verified that I am speaking with the correct person using two identifiers.  I spent 11 minutes on this video encounter  Location: Patient: home Provider: office   I discussed the limitations, risks, security and privacy concerns of performing an evaluation and management service by telephone and the availability of in person appointments. I also discussed with the patient that there may be a patient responsible charge related to this service. The patient expressed understanding and agreed to proceed.   History of Present Illness: Discussed the use of AI scribe software for clinical note transcription with the patient, who gave verbal consent to proceed.  History of Present Illness Mackenzie Rios is a 23 year old female with ulcerative colitis who presents with a suspected colitis flare.  She has been experiencing symptoms consistent with a colitis flare over the past couple of weeks, including blood in her stool, diarrhea with no bulk, and a worsening smell. She describes a sensation in her stomach as if 'there's something in there that's not coming out,' which she associates with previous colitis flare-ups.  She has a history of ulcerative colitis and previously received care from a gastroenterologist at [facility]. However, she has not been in contact with her recently and is unsure if her previous doctor is still practicing there. She was in the process of obtaining Humira through her previous gastroenterologist, but there were communication issues, and she was unable to secure the medication through Medicaid.  She has not used prednisone  in about a year, which was last prescribed in December 2024 she is not currently taking any medications other than vitamins, including vitamin D.  Her last colitis flare-up occurred in December 2024. She has not had a recent complete blood count (CBC) or other  lab work related to her condition.     Observations/Objective: Patient alert and oriented no sign of distress noted  Assessment and Plan: Ulcerative colitis with rectal bleeding (HCC) . - predniSONE  (DELTASONE ) 20 MG tablet; Take 2 tablets (40 mg total) by mouth daily with breakfast for 7 days, THEN 1.5 tablets (30 mg total) daily with breakfast for 7 days, THEN 1 tablet (20 mg total) daily with breakfast for 7 days, THEN 0.5 tablets (10 mg total) daily with breakfast for 7 days.  Dispense: 35 tablet; Refill: 0   Start around 09/12/2024 predniSONE  (DELTASONE ) 5 MG tablet; Take 1 tablet (5 mg total) by mouth daily with breakfast.  Dispense: 7 tablet; Refill: 0 - Ambulatory referral to Gastroenterology - CBC; Future     Follow Up Instructions:    I discussed the assessment and treatment plan with the patient. The patient was provided an opportunity to ask questions and all were answered. The patient agreed with the plan and demonstrated an understanding of the instructions.   The patient was advised to call back or seek an in-person evaluation if the symptoms worsen or if the condition fails to improve as anticipated.

## 2024-08-14 NOTE — Patient Instructions (Signed)
 1. Ulcerative colitis with rectal bleeding, unspecified location (HCC) (Primary)  - predniSONE  (DELTASONE ) 20 MG tablet; Take 2 tablets (40 mg total) by mouth daily with breakfast for 7 days, THEN 1.5 tablets (30 mg total) daily with breakfast for 7 days, THEN 1 tablet (20 mg total) daily with breakfast for 7 days, THEN 0.5 tablets (10 mg total) daily with breakfast for 7 days.  Dispense: 35 tablet; Refill: 0   Start on 09/10/2024 - predniSONE  (DELTASONE ) 5 MG tablet; Take 1 tablet (5 mg total) by mouth daily with breakfast.  Dispense: 7 tablet; Refill: 0 - Ambulatory referral to Gastroenterology     It is important that you exercise regularly at least 30 minutes 5 times a week as tolerated  Think about what you will eat, plan ahead. Choose  clean, green, fresh or frozen over canned, processed or packaged foods which are more sugary, salty and fatty. 70 to 75% of food eaten should be vegetables and fruit. Three meals at set times with snacks allowed between meals, but they must be fruit or vegetables. Aim to eat over a 12 hour period , example 7 am to 7 pm, and STOP after  your last meal of the day. Drink water,generally about 64 ounces per day, no other drink is as healthy. Fruit juice is best enjoyed in a healthy way, by EATING the fruit.  Thanks for choosing [facility] we consider it a privelige to serve you.

## 2024-08-14 NOTE — Telephone Encounter (Signed)
 Please advise [facility]

## 2024-08-14 NOTE — Assessment & Plan Note (Signed)
. -   predniSONE  (DELTASONE ) 20 MG tablet; Take 2 tablets (40 mg total) by mouth daily with breakfast for 7 days, THEN 1.5 tablets (30 mg total) daily with breakfast for 7 days, THEN 1 tablet (20 mg total) daily with breakfast for 7 days, THEN 0.5 tablets (10 mg total) daily with breakfast for 7 days.  Dispense: 35 tablet; Refill: 0   Start around 09/12/2024 predniSONE  (DELTASONE ) 5 MG tablet; Take 1 tablet (5 mg total) by mouth daily with breakfast.  Dispense: 7 tablet; Refill: 0 - Ambulatory referral to Gastroenterology - CBC; Future

## 2024-08-17 ENCOUNTER — Telehealth: Payer: Self-pay

## 2024-08-17 NOTE — Telephone Encounter (Signed)
 Copied from CRM #[id number]. Topic: Clinical - Prescription Issue >> Aug 14, 2024 12:51 PM Fonda T wrote: Reason for CRM: Tammy calling from [facility], has questions regarding medication prescription that was sent, states same medication was sent but different dosages and directions.  Medication:  predniSONE  (DELTASONE ) 20 MG tablet predniSONE  (DELTASONE ) 5 MG tablet   Per Tammy needs clarity on prescriptions, requesting a return call back, at [phone]  [id number] [address], [place] [address] - [address] AT [facility] [address] [place] [address] Phone: [phone] Fax: [phone]  Spoke to pharmacy. Done [facility]

## 2024-08-28 ENCOUNTER — Other Ambulatory Visit: Payer: Self-pay

## 2024-08-31 ENCOUNTER — Other Ambulatory Visit: Payer: Self-pay

## 2024-09-01 ENCOUNTER — Other Ambulatory Visit: Payer: Self-pay

## 2024-09-11 ENCOUNTER — Ambulatory Visit: Payer: Self-pay | Admitting: Nurse Practitioner

## 2024-10-12 ENCOUNTER — Encounter: Payer: Self-pay | Admitting: Hematology and Oncology

## 2024-12-07 ENCOUNTER — Emergency Department (HOSPITAL_BASED_OUTPATIENT_CLINIC_OR_DEPARTMENT_OTHER): Payer: Self-pay

## 2024-12-07 ENCOUNTER — Other Ambulatory Visit: Payer: Self-pay

## 2024-12-07 ENCOUNTER — Emergency Department (HOSPITAL_BASED_OUTPATIENT_CLINIC_OR_DEPARTMENT_OTHER)
Admission: EM | Admit: 2024-12-07 | Discharge: 2024-12-07 | Disposition: A | Discharge status: Home / Self Care | Payer: Self-pay | Attending: Emergency Medicine | Admitting: Emergency Medicine

## 2024-12-07 ENCOUNTER — Encounter (HOSPITAL_BASED_OUTPATIENT_CLINIC_OR_DEPARTMENT_OTHER): Payer: Self-pay

## 2024-12-07 ENCOUNTER — Encounter: Payer: Self-pay | Admitting: Hematology and Oncology

## 2024-12-07 DIAGNOSIS — D500 Iron deficiency anemia secondary to blood loss (chronic): Secondary | ICD-10-CM | POA: Insufficient documentation

## 2024-12-07 DIAGNOSIS — K625 Hemorrhage of anus and rectum: Secondary | ICD-10-CM | POA: Insufficient documentation

## 2024-12-07 LAB — CBC WITH DIFFERENTIAL/PLATELET
Abs Immature Granulocytes: 0.03 K/uL (ref 0.00–0.07)
Basophils Absolute: 0 K/uL (ref 0.0–0.1)
Basophils Relative: 0 %
Eosinophils Absolute: 0.3 K/uL (ref 0.0–0.5)
Eosinophils Relative: 4 %
HCT: 26.7 % — ABNORMAL LOW (ref 36.0–46.0)
Hemoglobin: 7.6 g/dL — ABNORMAL LOW (ref 12.0–15.0)
Immature Granulocytes: 0 %
Lymphocytes Relative: 18 %
Lymphs Abs: 1.6 K/uL (ref 0.7–4.0)
MCH: 19.3 pg — ABNORMAL LOW (ref 26.0–34.0)
MCHC: 28.5 g/dL — ABNORMAL LOW (ref 30.0–36.0)
MCV: 67.8 fL — ABNORMAL LOW (ref 80.0–100.0)
Monocytes Absolute: 1 K/uL (ref 0.1–1.0)
Monocytes Relative: 10 %
Neutro Abs: 6.3 K/uL (ref 1.7–7.7)
Neutrophils Relative %: 68 %
Platelets: 422 K/uL — ABNORMAL HIGH (ref 150–400)
RBC: 3.94 MIL/uL (ref 3.87–5.11)
RDW: 19.2 % — ABNORMAL HIGH (ref 11.5–15.5)
WBC: 9.2 K/uL (ref 4.0–10.5)
nRBC: 0 % (ref 0.0–0.2)

## 2024-12-07 LAB — COMPREHENSIVE METABOLIC PANEL WITH GFR
ALT: 8 U/L (ref 0–44)
AST: 18 U/L (ref 15–41)
Albumin: 4.2 g/dL (ref 3.5–5.0)
Alkaline Phosphatase: 76 U/L (ref 38–126)
Anion gap: 12 (ref 5–15)
BUN: 8 mg/dL (ref 6–20)
CO2: 23 mmol/L (ref 22–32)
Calcium: 9.1 mg/dL (ref 8.9–10.3)
Chloride: 104 mmol/L (ref 98–111)
Creatinine, Ser: 0.54 mg/dL (ref 0.44–1.00)
GFR, Estimated: >60 mL/min
Glucose, Bld: 97 mg/dL (ref 70–99)
Potassium: 3.7 mmol/L (ref 3.5–5.1)
Sodium: 139 mmol/L (ref 135–145)
Total Bilirubin: 0.4 mg/dL (ref 0.0–1.2)
Total Protein: 8.1 g/dL (ref 6.5–8.1)

## 2024-12-07 LAB — PREGNANCY, URINE: Preg Test, Ur: NEGATIVE

## 2024-12-07 MED ORDER — IOHEXOL 300 MG/ML  SOLN
75.0000 mL | Freq: Once | INTRAMUSCULAR | Status: AC | PRN
  Administered 2024-12-07: 75 mL via INTRAVENOUS

## 2024-12-07 MED ORDER — PREDNISONE 20 MG PO TABS: 40.0000 mg | ORAL_TABLET | Freq: Every day | ORAL | 0 refills | Status: AC

## 2024-12-07 MED ORDER — METHYLPREDNISOLONE SODIUM SUCC 125 MG IJ SOLR
125.0000 mg | Freq: Once | INTRAMUSCULAR | Status: AC
  Administered 2024-12-07: 125 mg via INTRAVENOUS
  Filled 2024-12-07: qty 2

## 2024-12-07 NOTE — Discharge Instructions (Addendum)
 Please restart iron  supplementation daily. I will restart you on 40 mg of prednisone  daily.  Please follow-up with GI doctor for tapering and further workup. You need to call to schedule appointment with GI doctor for follow up.  In the meantime, please have your hemoglobin recheck with PCP in 5-7 days.  Return to emergency room for lightheadedness, passing out or any new or worsening symptoms. If you can't get in for recheck of labs you can always come back ER or UC for lab recheck.

## 2024-12-07 NOTE — ED Notes (Signed)
 D/c paperwork reviewed with pt, including prescriptions and follow up care.  All questions and/or concerns addressed at time of d/c.  No further needs expressed. . Pt verbalized understanding, Ambulatory without assistance to ED exit, NAD.

## 2024-12-07 NOTE — ED Triage Notes (Signed)
 Reports hx of ulcerative colitis. rectal bleeding for 6 months. Lightheadedness and diarrhea for 1 week.   Denies N/V, fevers

## 2024-12-07 NOTE — ED Provider Notes (Signed)
 " [facility] Provider Note   CSN: [id number] Arrival date & time: 12/07/24  1355     Patient presents with: Rectal Bleeding   Mackenzie Rios is a 24 y.o. female.  Patient with past medical history of iron  deficiency anemia, malnutrition, ulcerative colitis presents to emergency room with complaint of rectal bleeding.  Patient reports that she has had blood or pus in her stool for most every bowel movement for the past 6 months. Over the last week she started having lightheadedness/fatigue and feels she is only passing blood and passed with her bowel movements.  Denies syncope, muscle pain, SOB. Reports she is feeling well today. Has required transfusion in the past and admission. Not on medication for UC at this time, no GI Doc at this time but appears PCP is trying to get OP Apt with [facility]. No fever or abdominal pain at this time.     Rectal Bleeding      Prior to Admission medications  Medication Sig Start Date End Date Taking? Authorizing Provider  predniSONE  (DELTASONE ) 20 MG tablet Take 2 tablets (40 mg total) by mouth daily. 12/07/24 01/06/25 Yes Tierrah Anastos, Warren SAILOR, PA-C  ASHWAGANDHA PO Take by mouth.    [provider]  escitalopram  (LEXAPRO ) 10 MG tablet TAKE 1 TABLET BY MOUTH EVERY DAY Patient not taking: Reported on 08/14/2024 10/18/23   Paseda, Folashade R, FNP  Ferrous Sulfate  (IRON  PO) Take 45 mg by mouth.    [provider]  fluconazole  (DIFLUCAN ) 150 MG tablet TAKE 1 TABLET (150 MG TOTAL) BY MOUTH DAILY. REPEAT IN 24 HOURS IF NEEDED Patient not taking: Reported on 08/14/2024 05/22/24   Fredirick Glenys RAMAN, MD  hydrOXYzine  (VISTARIL ) 25 MG capsule Take 1 capsule (25 mg total) by mouth 3 (three) times daily as needed. Patient not taking: Reported on 08/14/2024 08/23/23   Paseda, Folashade R, FNP  HYRIMOZ 40 MG/0.4ML SOAJ Inject 40 mg into the skin every 14 (fourteen) days. Patient not taking: Reported on 08/14/2024 04/19/23    [provider]  melatonin 5 MG TABS Take 5 mg by mouth.    [provider]  VITAMIN D PO Take 2,000 Units by mouth.    [provider]    Allergies: Patient has no known allergies.    Review of Systems  Constitutional:  Positive for activity change.  Gastrointestinal:  Positive for hematochezia.    Updated Vital Signs BP (!) 129/93   Pulse 80   Temp 97.7 F (36.5 C)   Resp 18   LMP 12/05/2024 (Exact Date)   SpO2 100%   Physical Exam Vitals and nursing note reviewed.  Constitutional:      General: She is not in acute distress.    Appearance: She is not toxic-appearing.     Comments: Resting comfortably, eating in room  HENT:     Head: Normocephalic and atraumatic.  Eyes:     General: No scleral icterus.    Conjunctiva/sclera: Conjunctivae normal.     Comments: Pale conjunctiva   Cardiovascular:     Rate and Rhythm: Normal rate and regular rhythm.     Pulses: Normal pulses.     Heart sounds: Normal heart sounds.  Pulmonary:     Effort: Pulmonary effort is normal. No respiratory distress.     Breath sounds: Normal breath sounds.  Abdominal:     General: Abdomen is flat. Bowel sounds are normal. There is no distension.     Palpations:  Abdomen is soft. There is no mass.     Tenderness: There is no abdominal tenderness.  Skin:    General: Skin is warm and dry.     Findings: No lesion.  Neurological:     General: No focal deficit present.     Mental Status: She is alert and oriented to person, place, and time. Mental status is at baseline.     (all labs ordered are listed, but only abnormal results are displayed) Labs Reviewed  CBC WITH DIFFERENTIAL/PLATELET - Abnormal; Notable for the following components:      Result Value   Hemoglobin 7.6 (*)    HCT 26.7 (*)    MCV 67.8 (*)    MCH 19.3 (*)    MCHC 28.5 (*)    RDW 19.2 (*)    Platelets 422 (*)    All other components within normal limits  COMPREHENSIVE METABOLIC PANEL WITH GFR   PREGNANCY, URINE    EKG: None  Radiology: CT ABDOMEN PELVIS W CONTRAST Result Date: 12/07/2024 EXAM: CT ABDOMEN AND PELVIS WITH CONTRAST 12/07/2024 05:29:16 PM TECHNIQUE: CT of the abdomen and pelvis was performed with the administration of 75 mL of iohexol  (OMNIPAQUE ) 300 MG/ML solution. Multiplanar reformatted images are provided for review. Automated exposure control, iterative reconstruction, and/or weight-based adjustment of the mA/kV was utilized to reduce the radiation dose to as low as reasonably achievable. COMPARISON: CT abdomen and pelvis 10/29/2022. CLINICAL HISTORY: Abdominal pain, acute, nonlocalized; UC. FINDINGS: LOWER CHEST: No acute abnormality. LIVER: The liver is unremarkable. GALLBLADDER AND BILE DUCTS: Gallbladder is unremarkable. No biliary ductal dilatation. SPLEEN: No acute abnormality. PANCREAS: No acute abnormality. ADRENAL GLANDS: No acute abnormality. KIDNEYS, URETERS AND BLADDER: No stones in the kidneys or ureters. No hydronephrosis. No perinephric or periureteral stranding. Urinary bladder is unremarkable. GI AND BOWEL: Stomach demonstrates no acute abnormality. The entire colon is completely decompressed which limits evaluation. There is questionable wall thickening of the descending colon to the level of the rectum. There is no pneumatosis. Appendix is not visualized. There is no bowel obstruction. PERITONEUM AND RETROPERITONEUM: No ascites. No free air. VASCULATURE: Aorta is normal in caliber. LYMPH NODES: No lymphadenopathy. REPRODUCTIVE ORGANS: There is a tampon in the vagina. BONES AND SOFT TISSUES: No acute osseous abnormality. No focal soft tissue abnormality. IMPRESSION: 1. Questionable wall thickening of the descending colon to the level of the rectum, with no pneumatosis or free air. Findings are concerning for colitis in the appropriate clinical setting. Electronically signed by: Greig Pique MD 12/07/2024 06:33 PM EST RP Workstation: [id number]     Procedures    Medications Ordered in the ED  methylPREDNISolone  sodium succinate (SOLU-MEDROL ) 125 mg/2 mL injection 125 mg (has no administration in time range)  iohexol  (OMNIPAQUE ) 300 MG/ML solution 75 mL (75 mLs Intravenous Contrast Given 12/07/24 1716)    Clinical Course as of 12/07/24 1844  Mon Dec 07, 2024  1559 Hemoglobin(!): 7.6 [JB]    Clinical Course User Index [JB] Jameica Couts, Warren SAILOR, PA-C                                 Medical Decision Making Amount and/or Complexity of Data Reviewed Labs: ordered. Decision-making details documented in ED Course. Radiology: ordered.  Risk Prescription drug management.   This patient presents to the ED for concern of Gi bleeding, this involves an extensive number of treatment options, and is a complaint that carries with it a  high risk of complications and morbidity.  The differential diagnosis includes cholecystitis, AAA, appendicitis, renal stone, UTI, UC   Co morbidities that complicate the patient evaluation  IDA UC   Additional history obtained:  Additional history obtained from admission 10/29/22 for similar Flex sig 2022   Lab Tests:  I personally interpreted labs.  The pertinent results include:   CBC with hcg 7.6, normal WBC. CMP unremarkable. Urine pregnancy negative.    Cardiac Monitoring: / EKG:  The patient was maintained on a cardiac monitor.     Consultations Obtained:  I requested consultation with the GI Dr Dianna  and discussed lab and imaging findings as well as pertinent plan - they recommend: CT scan to rule out complication, likely okay to d/c with OP f/u for recheck of hgb and start on oral steroids. Recommending PO 40mg  daily for 30 days and f/u for taper.    Problem List / ED Course / Critical interventions / Medication management  Patient presents with complaint of months of rectal bleeding.  She reports that over the last month this has gotten worse.  She notes blood and pus and every bowel movement  she has.  Has 1-3 bowel movements per day worse after eating.  On arrival hemodynamically stable and well-appearing.  She has no focal area of abdominal tenderness but does report some intermittent abdominal cramping after eating.  I did obtain CBC which shows hemoglobin of 7.6.  This is down from 1 year ago at 67. She is not tachycardic, she reports she is very minimally symptomatic, not on blood thinner. No presyncope/syncope. CMP is unremarkable.  Pregnancy test is negative.  Obtain CT scan to rule out any acute findings. Discussed with GI who recommended obtaining CT imaging.  If CT imaging shows no acute findings okay to restart on prednisone  and follow-up outpatient.  Patient scan overall unremarkable.  Her vitals have been stable throughout stay.  She has no sign of systemic illness.  She is tolerating oral intake.  She does have appropriate follow-up.  She was given very strict return precautions given that her hemoglobin has dropped.  She expresses understanding and agrees to plan.       Final diagnoses:  Rectal bleeding  Iron  deficiency anemia due to chronic blood loss    ED Discharge Orders          Ordered    predniSONE  (DELTASONE ) 20 MG tablet  Daily        12/07/24 1838               Shermon Warren SAILOR, PA-C 12/07/24 1844    Neysa Caron PARAS, DO 12/10/24 0700  "
